# Patient Record
Sex: Female | Born: 1965 | Hispanic: No | Marital: Single | Smoking: Never smoker
Health system: Southern US, Community
[De-identification: ages and names within clinical notes are randomized; demographics above are authoritative.]

## PROBLEM LIST (undated history)

## (undated) DIAGNOSIS — C50919 Malignant neoplasm of unspecified site of unspecified female breast: Secondary | ICD-10-CM

## (undated) DIAGNOSIS — L309 Dermatitis, unspecified: Secondary | ICD-10-CM

## (undated) DIAGNOSIS — F32A Depression, unspecified: Secondary | ICD-10-CM

## (undated) DIAGNOSIS — T7840XA Allergy, unspecified, initial encounter: Secondary | ICD-10-CM

## (undated) DIAGNOSIS — F329 Major depressive disorder, single episode, unspecified: Secondary | ICD-10-CM

## (undated) DIAGNOSIS — A630 Anogenital (venereal) warts: Secondary | ICD-10-CM

## (undated) DIAGNOSIS — M1990 Unspecified osteoarthritis, unspecified site: Secondary | ICD-10-CM

## (undated) HISTORY — DX: Major depressive disorder, single episode, unspecified: F32.9

## (undated) HISTORY — DX: Dermatitis, unspecified: L30.9

## (undated) HISTORY — PX: MASTECTOMY: SHX3

## (undated) HISTORY — PX: OTHER SURGICAL HISTORY: SHX169

## (undated) HISTORY — DX: Anogenital (venereal) warts: A63.0

## (undated) HISTORY — PX: COSMETIC SURGERY: SHX468

## (undated) HISTORY — DX: Allergy, unspecified, initial encounter: T78.40XA

## (undated) HISTORY — DX: Depression, unspecified: F32.A

## (undated) HISTORY — DX: Unspecified osteoarthritis, unspecified site: M19.90

## (undated) HISTORY — DX: Malignant neoplasm of unspecified site of unspecified female breast: C50.919

---

## 2005-03-09 ENCOUNTER — Encounter: Admission: RE | Admit: 2005-03-09 | Discharge: 2005-03-09 | Payer: Self-pay | Admitting: Obstetrics and Gynecology

## 2006-05-07 ENCOUNTER — Encounter: Admission: RE | Admit: 2006-05-07 | Discharge: 2006-05-07 | Payer: Self-pay | Admitting: Obstetrics and Gynecology

## 2007-05-31 ENCOUNTER — Encounter: Admission: RE | Admit: 2007-05-31 | Discharge: 2007-05-31 | Payer: Self-pay | Admitting: Obstetrics and Gynecology

## 2007-06-04 ENCOUNTER — Encounter: Admission: RE | Admit: 2007-06-04 | Discharge: 2007-06-04 | Payer: Self-pay | Admitting: Obstetrics and Gynecology

## 2007-11-14 HISTORY — PX: BREAST BIOPSY: SHX20

## 2008-06-22 ENCOUNTER — Encounter: Admission: RE | Admit: 2008-06-22 | Discharge: 2008-06-22 | Payer: Self-pay | Admitting: Obstetrics and Gynecology

## 2008-07-06 ENCOUNTER — Encounter (INDEPENDENT_AMBULATORY_CARE_PROVIDER_SITE_OTHER): Payer: Self-pay | Admitting: Diagnostic Radiology

## 2008-07-06 ENCOUNTER — Encounter: Admission: RE | Admit: 2008-07-06 | Discharge: 2008-07-06 | Payer: Self-pay | Admitting: Obstetrics and Gynecology

## 2008-07-10 ENCOUNTER — Ambulatory Visit: Payer: Self-pay | Admitting: Oncology

## 2008-07-13 ENCOUNTER — Encounter: Admission: RE | Admit: 2008-07-13 | Discharge: 2008-07-13 | Payer: Self-pay | Admitting: Obstetrics and Gynecology

## 2008-07-16 ENCOUNTER — Encounter: Admission: RE | Admit: 2008-07-16 | Discharge: 2008-07-16 | Payer: Self-pay | Admitting: Obstetrics and Gynecology

## 2008-07-16 ENCOUNTER — Encounter (INDEPENDENT_AMBULATORY_CARE_PROVIDER_SITE_OTHER): Payer: Self-pay | Admitting: Diagnostic Radiology

## 2008-11-13 HISTORY — PX: BREAST RECONSTRUCTION: SHX9

## 2010-12-04 ENCOUNTER — Encounter: Payer: Self-pay | Admitting: Obstetrics and Gynecology

## 2010-12-05 ENCOUNTER — Encounter: Payer: Self-pay | Admitting: Obstetrics and Gynecology

## 2010-12-16 DIAGNOSIS — F4329 Adjustment disorder with other symptoms: Secondary | ICD-10-CM | POA: Insufficient documentation

## 2011-01-06 DIAGNOSIS — N83209 Unspecified ovarian cyst, unspecified side: Secondary | ICD-10-CM | POA: Insufficient documentation

## 2011-06-20 DIAGNOSIS — N912 Amenorrhea, unspecified: Secondary | ICD-10-CM | POA: Insufficient documentation

## 2011-07-06 DIAGNOSIS — F339 Major depressive disorder, recurrent, unspecified: Secondary | ICD-10-CM | POA: Insufficient documentation

## 2011-07-06 DIAGNOSIS — F0630 Mood disorder due to known physiological condition, unspecified: Secondary | ICD-10-CM | POA: Insufficient documentation

## 2012-07-26 DIAGNOSIS — Z01419 Encounter for gynecological examination (general) (routine) without abnormal findings: Secondary | ICD-10-CM | POA: Insufficient documentation

## 2013-08-12 LAB — HM MAMMOGRAPHY

## 2013-08-13 DIAGNOSIS — C50919 Malignant neoplasm of unspecified site of unspecified female breast: Secondary | ICD-10-CM | POA: Insufficient documentation

## 2013-08-13 DIAGNOSIS — Z853 Personal history of malignant neoplasm of breast: Secondary | ICD-10-CM | POA: Insufficient documentation

## 2013-10-06 LAB — HM PAP SMEAR: HM Pap smear: NEGATIVE

## 2016-01-07 DIAGNOSIS — S63642A Sprain of metacarpophalangeal joint of left thumb, initial encounter: Secondary | ICD-10-CM | POA: Insufficient documentation

## 2016-01-07 DIAGNOSIS — S5332XA Traumatic rupture of left ulnar collateral ligament, initial encounter: Secondary | ICD-10-CM | POA: Insufficient documentation

## 2016-01-11 ENCOUNTER — Other Ambulatory Visit: Payer: Self-pay | Admitting: Orthopedic Surgery

## 2016-01-11 DIAGNOSIS — S62512A Displaced fracture of proximal phalanx of left thumb, initial encounter for closed fracture: Secondary | ICD-10-CM | POA: Insufficient documentation

## 2016-01-27 ENCOUNTER — Encounter (HOSPITAL_BASED_OUTPATIENT_CLINIC_OR_DEPARTMENT_OTHER): Payer: Self-pay

## 2016-01-27 ENCOUNTER — Ambulatory Visit (HOSPITAL_BASED_OUTPATIENT_CLINIC_OR_DEPARTMENT_OTHER): Admit: 2016-01-27 | Payer: Self-pay | Admitting: Orthopedic Surgery

## 2016-01-27 SURGERY — REPAIR, LIGAMENT, ULNAR COLLATERAL
Anesthesia: Choice | Site: Thumb | Laterality: Left

## 2016-03-07 LAB — HM PAP SMEAR: HM PAP: NEGATIVE

## 2016-11-03 DIAGNOSIS — I972 Postmastectomy lymphedema syndrome: Secondary | ICD-10-CM | POA: Diagnosis not present

## 2016-11-04 DIAGNOSIS — I972 Postmastectomy lymphedema syndrome: Secondary | ICD-10-CM | POA: Diagnosis not present

## 2016-11-07 DIAGNOSIS — I972 Postmastectomy lymphedema syndrome: Secondary | ICD-10-CM | POA: Diagnosis not present

## 2016-11-08 DIAGNOSIS — I972 Postmastectomy lymphedema syndrome: Secondary | ICD-10-CM | POA: Diagnosis not present

## 2016-11-09 DIAGNOSIS — I972 Postmastectomy lymphedema syndrome: Secondary | ICD-10-CM | POA: Diagnosis not present

## 2017-01-23 DIAGNOSIS — N939 Abnormal uterine and vaginal bleeding, unspecified: Secondary | ICD-10-CM | POA: Diagnosis not present

## 2017-01-23 DIAGNOSIS — Z30432 Encounter for removal of intrauterine contraceptive device: Secondary | ICD-10-CM | POA: Diagnosis not present

## 2017-01-23 DIAGNOSIS — Z01419 Encounter for gynecological examination (general) (routine) without abnormal findings: Secondary | ICD-10-CM | POA: Diagnosis not present

## 2017-01-23 DIAGNOSIS — R5383 Other fatigue: Secondary | ICD-10-CM | POA: Diagnosis not present

## 2017-01-23 LAB — HIV ANTIBODY (ROUTINE TESTING W REFLEX): HIV: NEGATIVE

## 2017-01-25 DIAGNOSIS — Z7951 Long term (current) use of inhaled steroids: Secondary | ICD-10-CM | POA: Diagnosis not present

## 2017-01-25 DIAGNOSIS — I972 Postmastectomy lymphedema syndrome: Secondary | ICD-10-CM | POA: Diagnosis not present

## 2017-01-25 DIAGNOSIS — F329 Major depressive disorder, single episode, unspecified: Secondary | ICD-10-CM | POA: Diagnosis not present

## 2017-01-25 DIAGNOSIS — Z803 Family history of malignant neoplasm of breast: Secondary | ICD-10-CM | POA: Diagnosis not present

## 2017-01-25 DIAGNOSIS — Z9012 Acquired absence of left breast and nipple: Secondary | ICD-10-CM | POA: Diagnosis not present

## 2017-01-25 DIAGNOSIS — L905 Scar conditions and fibrosis of skin: Secondary | ICD-10-CM | POA: Diagnosis not present

## 2017-01-25 DIAGNOSIS — R079 Chest pain, unspecified: Secondary | ICD-10-CM | POA: Diagnosis not present

## 2017-01-25 DIAGNOSIS — M797 Fibromyalgia: Secondary | ICD-10-CM | POA: Diagnosis not present

## 2017-01-25 DIAGNOSIS — M419 Scoliosis, unspecified: Secondary | ICD-10-CM | POA: Diagnosis not present

## 2017-01-25 DIAGNOSIS — J302 Other seasonal allergic rhinitis: Secondary | ICD-10-CM | POA: Diagnosis not present

## 2017-01-25 DIAGNOSIS — F419 Anxiety disorder, unspecified: Secondary | ICD-10-CM | POA: Diagnosis not present

## 2017-01-25 DIAGNOSIS — Z853 Personal history of malignant neoplasm of breast: Secondary | ICD-10-CM | POA: Diagnosis not present

## 2017-01-25 DIAGNOSIS — M79602 Pain in left arm: Secondary | ICD-10-CM | POA: Diagnosis not present

## 2017-01-25 DIAGNOSIS — Z79899 Other long term (current) drug therapy: Secondary | ICD-10-CM | POA: Diagnosis not present

## 2017-01-25 DIAGNOSIS — Z9221 Personal history of antineoplastic chemotherapy: Secondary | ICD-10-CM | POA: Diagnosis not present

## 2017-01-25 DIAGNOSIS — Z923 Personal history of irradiation: Secondary | ICD-10-CM | POA: Diagnosis not present

## 2017-02-18 DIAGNOSIS — R21 Rash and other nonspecific skin eruption: Secondary | ICD-10-CM | POA: Diagnosis not present

## 2017-02-18 DIAGNOSIS — A6920 Lyme disease, unspecified: Secondary | ICD-10-CM | POA: Diagnosis not present

## 2017-03-01 DIAGNOSIS — Z9221 Personal history of antineoplastic chemotherapy: Secondary | ICD-10-CM | POA: Diagnosis not present

## 2017-03-01 DIAGNOSIS — Z923 Personal history of irradiation: Secondary | ICD-10-CM | POA: Diagnosis not present

## 2017-03-01 DIAGNOSIS — Z7951 Long term (current) use of inhaled steroids: Secondary | ICD-10-CM | POA: Diagnosis not present

## 2017-03-01 DIAGNOSIS — Z79899 Other long term (current) drug therapy: Secondary | ICD-10-CM | POA: Diagnosis not present

## 2017-03-01 DIAGNOSIS — F419 Anxiety disorder, unspecified: Secondary | ICD-10-CM | POA: Diagnosis not present

## 2017-03-01 DIAGNOSIS — R079 Chest pain, unspecified: Secondary | ICD-10-CM | POA: Diagnosis not present

## 2017-03-01 DIAGNOSIS — I972 Postmastectomy lymphedema syndrome: Secondary | ICD-10-CM | POA: Diagnosis not present

## 2017-03-01 DIAGNOSIS — M79602 Pain in left arm: Secondary | ICD-10-CM | POA: Diagnosis not present

## 2017-03-01 DIAGNOSIS — F329 Major depressive disorder, single episode, unspecified: Secondary | ICD-10-CM | POA: Diagnosis not present

## 2017-03-01 DIAGNOSIS — Z853 Personal history of malignant neoplasm of breast: Secondary | ICD-10-CM | POA: Diagnosis not present

## 2017-03-01 DIAGNOSIS — M419 Scoliosis, unspecified: Secondary | ICD-10-CM | POA: Diagnosis not present

## 2017-03-01 DIAGNOSIS — M797 Fibromyalgia: Secondary | ICD-10-CM | POA: Diagnosis not present

## 2017-03-01 DIAGNOSIS — L905 Scar conditions and fibrosis of skin: Secondary | ICD-10-CM | POA: Diagnosis not present

## 2017-03-01 DIAGNOSIS — Z9012 Acquired absence of left breast and nipple: Secondary | ICD-10-CM | POA: Diagnosis not present

## 2017-03-01 DIAGNOSIS — Z803 Family history of malignant neoplasm of breast: Secondary | ICD-10-CM | POA: Diagnosis not present

## 2017-03-01 DIAGNOSIS — J302 Other seasonal allergic rhinitis: Secondary | ICD-10-CM | POA: Diagnosis not present

## 2017-03-06 DIAGNOSIS — R922 Inconclusive mammogram: Secondary | ICD-10-CM | POA: Diagnosis not present

## 2017-03-06 DIAGNOSIS — Z803 Family history of malignant neoplasm of breast: Secondary | ICD-10-CM | POA: Diagnosis not present

## 2017-03-06 DIAGNOSIS — I890 Lymphedema, not elsewhere classified: Secondary | ICD-10-CM | POA: Diagnosis not present

## 2017-03-06 DIAGNOSIS — Z170 Estrogen receptor positive status [ER+]: Secondary | ICD-10-CM | POA: Diagnosis not present

## 2017-03-06 DIAGNOSIS — C50912 Malignant neoplasm of unspecified site of left female breast: Secondary | ICD-10-CM | POA: Diagnosis not present

## 2017-03-06 DIAGNOSIS — Z6826 Body mass index (BMI) 26.0-26.9, adult: Secondary | ICD-10-CM | POA: Diagnosis not present

## 2017-03-08 DIAGNOSIS — M79602 Pain in left arm: Secondary | ICD-10-CM | POA: Diagnosis not present

## 2017-03-08 DIAGNOSIS — F419 Anxiety disorder, unspecified: Secondary | ICD-10-CM | POA: Diagnosis not present

## 2017-03-08 DIAGNOSIS — Z9221 Personal history of antineoplastic chemotherapy: Secondary | ICD-10-CM | POA: Diagnosis not present

## 2017-03-08 DIAGNOSIS — Z923 Personal history of irradiation: Secondary | ICD-10-CM | POA: Diagnosis not present

## 2017-03-08 DIAGNOSIS — M797 Fibromyalgia: Secondary | ICD-10-CM | POA: Diagnosis not present

## 2017-03-08 DIAGNOSIS — I972 Postmastectomy lymphedema syndrome: Secondary | ICD-10-CM | POA: Diagnosis not present

## 2017-03-08 DIAGNOSIS — Z853 Personal history of malignant neoplasm of breast: Secondary | ICD-10-CM | POA: Diagnosis not present

## 2017-03-08 DIAGNOSIS — R079 Chest pain, unspecified: Secondary | ICD-10-CM | POA: Diagnosis not present

## 2017-03-08 DIAGNOSIS — F329 Major depressive disorder, single episode, unspecified: Secondary | ICD-10-CM | POA: Diagnosis not present

## 2017-03-08 DIAGNOSIS — J302 Other seasonal allergic rhinitis: Secondary | ICD-10-CM | POA: Diagnosis not present

## 2017-03-08 DIAGNOSIS — Z7951 Long term (current) use of inhaled steroids: Secondary | ICD-10-CM | POA: Diagnosis not present

## 2017-03-08 DIAGNOSIS — L905 Scar conditions and fibrosis of skin: Secondary | ICD-10-CM | POA: Diagnosis not present

## 2017-03-08 DIAGNOSIS — M419 Scoliosis, unspecified: Secondary | ICD-10-CM | POA: Diagnosis not present

## 2017-03-08 DIAGNOSIS — Z9012 Acquired absence of left breast and nipple: Secondary | ICD-10-CM | POA: Diagnosis not present

## 2017-03-08 DIAGNOSIS — Z79899 Other long term (current) drug therapy: Secondary | ICD-10-CM | POA: Diagnosis not present

## 2017-03-08 DIAGNOSIS — Z803 Family history of malignant neoplasm of breast: Secondary | ICD-10-CM | POA: Diagnosis not present

## 2017-03-15 DIAGNOSIS — Z853 Personal history of malignant neoplasm of breast: Secondary | ICD-10-CM | POA: Diagnosis not present

## 2017-03-15 DIAGNOSIS — Z79899 Other long term (current) drug therapy: Secondary | ICD-10-CM | POA: Diagnosis not present

## 2017-03-15 DIAGNOSIS — Z9221 Personal history of antineoplastic chemotherapy: Secondary | ICD-10-CM | POA: Diagnosis not present

## 2017-03-15 DIAGNOSIS — J302 Other seasonal allergic rhinitis: Secondary | ICD-10-CM | POA: Diagnosis not present

## 2017-03-15 DIAGNOSIS — M797 Fibromyalgia: Secondary | ICD-10-CM | POA: Diagnosis not present

## 2017-03-15 DIAGNOSIS — Z923 Personal history of irradiation: Secondary | ICD-10-CM | POA: Diagnosis not present

## 2017-03-15 DIAGNOSIS — F329 Major depressive disorder, single episode, unspecified: Secondary | ICD-10-CM | POA: Diagnosis not present

## 2017-03-15 DIAGNOSIS — M79602 Pain in left arm: Secondary | ICD-10-CM | POA: Diagnosis not present

## 2017-03-15 DIAGNOSIS — M419 Scoliosis, unspecified: Secondary | ICD-10-CM | POA: Diagnosis not present

## 2017-03-15 DIAGNOSIS — I972 Postmastectomy lymphedema syndrome: Secondary | ICD-10-CM | POA: Diagnosis not present

## 2017-03-15 DIAGNOSIS — R079 Chest pain, unspecified: Secondary | ICD-10-CM | POA: Diagnosis not present

## 2017-03-15 DIAGNOSIS — F419 Anxiety disorder, unspecified: Secondary | ICD-10-CM | POA: Diagnosis not present

## 2017-03-15 DIAGNOSIS — Z9012 Acquired absence of left breast and nipple: Secondary | ICD-10-CM | POA: Diagnosis not present

## 2017-03-15 DIAGNOSIS — L905 Scar conditions and fibrosis of skin: Secondary | ICD-10-CM | POA: Diagnosis not present

## 2017-03-15 DIAGNOSIS — Z803 Family history of malignant neoplasm of breast: Secondary | ICD-10-CM | POA: Diagnosis not present

## 2017-03-15 DIAGNOSIS — Z7951 Long term (current) use of inhaled steroids: Secondary | ICD-10-CM | POA: Diagnosis not present

## 2017-04-05 DIAGNOSIS — L905 Scar conditions and fibrosis of skin: Secondary | ICD-10-CM | POA: Diagnosis not present

## 2017-04-05 DIAGNOSIS — M797 Fibromyalgia: Secondary | ICD-10-CM | POA: Diagnosis not present

## 2017-04-05 DIAGNOSIS — I972 Postmastectomy lymphedema syndrome: Secondary | ICD-10-CM | POA: Diagnosis not present

## 2017-04-05 DIAGNOSIS — M419 Scoliosis, unspecified: Secondary | ICD-10-CM | POA: Diagnosis not present

## 2017-04-05 DIAGNOSIS — M79602 Pain in left arm: Secondary | ICD-10-CM | POA: Diagnosis not present

## 2017-04-05 DIAGNOSIS — J302 Other seasonal allergic rhinitis: Secondary | ICD-10-CM | POA: Diagnosis not present

## 2017-04-05 DIAGNOSIS — Z7951 Long term (current) use of inhaled steroids: Secondary | ICD-10-CM | POA: Diagnosis not present

## 2017-04-05 DIAGNOSIS — Z9221 Personal history of antineoplastic chemotherapy: Secondary | ICD-10-CM | POA: Diagnosis not present

## 2017-04-05 DIAGNOSIS — F329 Major depressive disorder, single episode, unspecified: Secondary | ICD-10-CM | POA: Diagnosis not present

## 2017-04-05 DIAGNOSIS — R079 Chest pain, unspecified: Secondary | ICD-10-CM | POA: Diagnosis not present

## 2017-04-05 DIAGNOSIS — Z79899 Other long term (current) drug therapy: Secondary | ICD-10-CM | POA: Diagnosis not present

## 2017-04-05 DIAGNOSIS — Z803 Family history of malignant neoplasm of breast: Secondary | ICD-10-CM | POA: Diagnosis not present

## 2017-04-05 DIAGNOSIS — Z853 Personal history of malignant neoplasm of breast: Secondary | ICD-10-CM | POA: Diagnosis not present

## 2017-04-05 DIAGNOSIS — Z923 Personal history of irradiation: Secondary | ICD-10-CM | POA: Diagnosis not present

## 2017-04-05 DIAGNOSIS — F419 Anxiety disorder, unspecified: Secondary | ICD-10-CM | POA: Diagnosis not present

## 2017-04-05 DIAGNOSIS — Z9012 Acquired absence of left breast and nipple: Secondary | ICD-10-CM | POA: Diagnosis not present

## 2017-04-25 DIAGNOSIS — Z79899 Other long term (current) drug therapy: Secondary | ICD-10-CM | POA: Diagnosis not present

## 2017-04-25 DIAGNOSIS — Z7951 Long term (current) use of inhaled steroids: Secondary | ICD-10-CM | POA: Diagnosis not present

## 2017-04-25 DIAGNOSIS — Z853 Personal history of malignant neoplasm of breast: Secondary | ICD-10-CM | POA: Diagnosis not present

## 2017-04-25 DIAGNOSIS — F419 Anxiety disorder, unspecified: Secondary | ICD-10-CM | POA: Diagnosis not present

## 2017-04-25 DIAGNOSIS — R079 Chest pain, unspecified: Secondary | ICD-10-CM | POA: Diagnosis not present

## 2017-04-25 DIAGNOSIS — M797 Fibromyalgia: Secondary | ICD-10-CM | POA: Diagnosis not present

## 2017-04-25 DIAGNOSIS — M419 Scoliosis, unspecified: Secondary | ICD-10-CM | POA: Diagnosis not present

## 2017-04-25 DIAGNOSIS — F329 Major depressive disorder, single episode, unspecified: Secondary | ICD-10-CM | POA: Diagnosis not present

## 2017-04-25 DIAGNOSIS — Z803 Family history of malignant neoplasm of breast: Secondary | ICD-10-CM | POA: Diagnosis not present

## 2017-04-25 DIAGNOSIS — J302 Other seasonal allergic rhinitis: Secondary | ICD-10-CM | POA: Diagnosis not present

## 2017-04-25 DIAGNOSIS — L905 Scar conditions and fibrosis of skin: Secondary | ICD-10-CM | POA: Diagnosis not present

## 2017-04-25 DIAGNOSIS — Z9012 Acquired absence of left breast and nipple: Secondary | ICD-10-CM | POA: Diagnosis not present

## 2017-04-25 DIAGNOSIS — I972 Postmastectomy lymphedema syndrome: Secondary | ICD-10-CM | POA: Diagnosis not present

## 2017-04-25 DIAGNOSIS — M79602 Pain in left arm: Secondary | ICD-10-CM | POA: Diagnosis not present

## 2017-04-25 DIAGNOSIS — Z923 Personal history of irradiation: Secondary | ICD-10-CM | POA: Diagnosis not present

## 2017-04-25 DIAGNOSIS — Z9221 Personal history of antineoplastic chemotherapy: Secondary | ICD-10-CM | POA: Diagnosis not present

## 2017-05-03 DIAGNOSIS — L905 Scar conditions and fibrosis of skin: Secondary | ICD-10-CM | POA: Diagnosis not present

## 2017-05-03 DIAGNOSIS — J302 Other seasonal allergic rhinitis: Secondary | ICD-10-CM | POA: Diagnosis not present

## 2017-05-03 DIAGNOSIS — M419 Scoliosis, unspecified: Secondary | ICD-10-CM | POA: Diagnosis not present

## 2017-05-03 DIAGNOSIS — Z7951 Long term (current) use of inhaled steroids: Secondary | ICD-10-CM | POA: Diagnosis not present

## 2017-05-03 DIAGNOSIS — R079 Chest pain, unspecified: Secondary | ICD-10-CM | POA: Diagnosis not present

## 2017-05-03 DIAGNOSIS — I972 Postmastectomy lymphedema syndrome: Secondary | ICD-10-CM | POA: Diagnosis not present

## 2017-05-03 DIAGNOSIS — Z9012 Acquired absence of left breast and nipple: Secondary | ICD-10-CM | POA: Diagnosis not present

## 2017-05-03 DIAGNOSIS — Z803 Family history of malignant neoplasm of breast: Secondary | ICD-10-CM | POA: Diagnosis not present

## 2017-05-03 DIAGNOSIS — Z853 Personal history of malignant neoplasm of breast: Secondary | ICD-10-CM | POA: Diagnosis not present

## 2017-05-03 DIAGNOSIS — Z923 Personal history of irradiation: Secondary | ICD-10-CM | POA: Diagnosis not present

## 2017-05-03 DIAGNOSIS — Z79899 Other long term (current) drug therapy: Secondary | ICD-10-CM | POA: Diagnosis not present

## 2017-05-03 DIAGNOSIS — F419 Anxiety disorder, unspecified: Secondary | ICD-10-CM | POA: Diagnosis not present

## 2017-05-03 DIAGNOSIS — M797 Fibromyalgia: Secondary | ICD-10-CM | POA: Diagnosis not present

## 2017-05-03 DIAGNOSIS — M79602 Pain in left arm: Secondary | ICD-10-CM | POA: Diagnosis not present

## 2017-05-03 DIAGNOSIS — F329 Major depressive disorder, single episode, unspecified: Secondary | ICD-10-CM | POA: Diagnosis not present

## 2017-05-03 DIAGNOSIS — Z9221 Personal history of antineoplastic chemotherapy: Secondary | ICD-10-CM | POA: Diagnosis not present

## 2017-05-10 DIAGNOSIS — I972 Postmastectomy lymphedema syndrome: Secondary | ICD-10-CM | POA: Diagnosis not present

## 2017-05-10 DIAGNOSIS — Z9012 Acquired absence of left breast and nipple: Secondary | ICD-10-CM | POA: Diagnosis not present

## 2017-05-10 DIAGNOSIS — Z7951 Long term (current) use of inhaled steroids: Secondary | ICD-10-CM | POA: Diagnosis not present

## 2017-05-10 DIAGNOSIS — L905 Scar conditions and fibrosis of skin: Secondary | ICD-10-CM | POA: Diagnosis not present

## 2017-05-10 DIAGNOSIS — Z803 Family history of malignant neoplasm of breast: Secondary | ICD-10-CM | POA: Diagnosis not present

## 2017-05-10 DIAGNOSIS — Z853 Personal history of malignant neoplasm of breast: Secondary | ICD-10-CM | POA: Diagnosis not present

## 2017-05-10 DIAGNOSIS — R079 Chest pain, unspecified: Secondary | ICD-10-CM | POA: Diagnosis not present

## 2017-05-10 DIAGNOSIS — Z9221 Personal history of antineoplastic chemotherapy: Secondary | ICD-10-CM | POA: Diagnosis not present

## 2017-05-10 DIAGNOSIS — Z923 Personal history of irradiation: Secondary | ICD-10-CM | POA: Diagnosis not present

## 2017-05-10 DIAGNOSIS — M79602 Pain in left arm: Secondary | ICD-10-CM | POA: Diagnosis not present

## 2017-05-10 DIAGNOSIS — M419 Scoliosis, unspecified: Secondary | ICD-10-CM | POA: Diagnosis not present

## 2017-05-10 DIAGNOSIS — F329 Major depressive disorder, single episode, unspecified: Secondary | ICD-10-CM | POA: Diagnosis not present

## 2017-05-10 DIAGNOSIS — F419 Anxiety disorder, unspecified: Secondary | ICD-10-CM | POA: Diagnosis not present

## 2017-05-10 DIAGNOSIS — J302 Other seasonal allergic rhinitis: Secondary | ICD-10-CM | POA: Diagnosis not present

## 2017-05-10 DIAGNOSIS — Z79899 Other long term (current) drug therapy: Secondary | ICD-10-CM | POA: Diagnosis not present

## 2017-05-10 DIAGNOSIS — M797 Fibromyalgia: Secondary | ICD-10-CM | POA: Diagnosis not present

## 2017-09-21 DIAGNOSIS — Z23 Encounter for immunization: Secondary | ICD-10-CM | POA: Diagnosis not present

## 2017-10-31 DIAGNOSIS — I972 Postmastectomy lymphedema syndrome: Secondary | ICD-10-CM | POA: Diagnosis not present

## 2017-11-01 DIAGNOSIS — I972 Postmastectomy lymphedema syndrome: Secondary | ICD-10-CM | POA: Diagnosis not present

## 2017-11-02 DIAGNOSIS — I972 Postmastectomy lymphedema syndrome: Secondary | ICD-10-CM | POA: Diagnosis not present

## 2017-11-05 DIAGNOSIS — I972 Postmastectomy lymphedema syndrome: Secondary | ICD-10-CM | POA: Diagnosis not present

## 2017-11-07 DIAGNOSIS — I972 Postmastectomy lymphedema syndrome: Secondary | ICD-10-CM | POA: Diagnosis not present

## 2017-11-08 DIAGNOSIS — I972 Postmastectomy lymphedema syndrome: Secondary | ICD-10-CM | POA: Diagnosis not present

## 2017-11-26 DIAGNOSIS — B373 Candidiasis of vulva and vagina: Secondary | ICD-10-CM | POA: Diagnosis not present

## 2017-11-26 DIAGNOSIS — J0140 Acute pansinusitis, unspecified: Secondary | ICD-10-CM | POA: Diagnosis not present

## 2017-11-26 DIAGNOSIS — R05 Cough: Secondary | ICD-10-CM | POA: Diagnosis not present

## 2018-02-05 DIAGNOSIS — H109 Unspecified conjunctivitis: Secondary | ICD-10-CM | POA: Diagnosis not present

## 2018-02-05 DIAGNOSIS — H1131 Conjunctival hemorrhage, right eye: Secondary | ICD-10-CM | POA: Diagnosis not present

## 2018-02-06 ENCOUNTER — Ambulatory Visit: Payer: Self-pay | Admitting: Physician Assistant

## 2018-02-07 DIAGNOSIS — H1131 Conjunctival hemorrhage, right eye: Secondary | ICD-10-CM | POA: Diagnosis not present

## 2018-02-07 DIAGNOSIS — H11823 Conjunctivochalasis, bilateral: Secondary | ICD-10-CM | POA: Diagnosis not present

## 2018-02-18 DIAGNOSIS — H40003 Preglaucoma, unspecified, bilateral: Secondary | ICD-10-CM | POA: Diagnosis not present

## 2018-02-25 ENCOUNTER — Ambulatory Visit: Payer: Self-pay | Admitting: Family Medicine

## 2018-03-12 DIAGNOSIS — Z853 Personal history of malignant neoplasm of breast: Secondary | ICD-10-CM | POA: Diagnosis not present

## 2018-03-12 DIAGNOSIS — F329 Major depressive disorder, single episode, unspecified: Secondary | ICD-10-CM | POA: Diagnosis not present

## 2018-03-12 DIAGNOSIS — Z79899 Other long term (current) drug therapy: Secondary | ICD-10-CM | POA: Diagnosis not present

## 2018-03-12 DIAGNOSIS — Z170 Estrogen receptor positive status [ER+]: Secondary | ICD-10-CM | POA: Diagnosis not present

## 2018-03-12 DIAGNOSIS — Z7952 Long term (current) use of systemic steroids: Secondary | ICD-10-CM | POA: Diagnosis not present

## 2018-03-12 DIAGNOSIS — J302 Other seasonal allergic rhinitis: Secondary | ICD-10-CM | POA: Diagnosis not present

## 2018-03-12 DIAGNOSIS — Z9012 Acquired absence of left breast and nipple: Secondary | ICD-10-CM | POA: Diagnosis not present

## 2018-03-12 DIAGNOSIS — Z79891 Long term (current) use of opiate analgesic: Secondary | ICD-10-CM | POA: Diagnosis not present

## 2018-03-12 DIAGNOSIS — Z803 Family history of malignant neoplasm of breast: Secondary | ICD-10-CM | POA: Diagnosis not present

## 2018-03-12 DIAGNOSIS — M797 Fibromyalgia: Secondary | ICD-10-CM | POA: Diagnosis not present

## 2018-03-12 DIAGNOSIS — Z923 Personal history of irradiation: Secondary | ICD-10-CM | POA: Diagnosis not present

## 2018-03-12 DIAGNOSIS — C50912 Malignant neoplasm of unspecified site of left female breast: Secondary | ICD-10-CM | POA: Diagnosis not present

## 2018-03-12 DIAGNOSIS — F419 Anxiety disorder, unspecified: Secondary | ICD-10-CM | POA: Diagnosis not present

## 2018-03-12 DIAGNOSIS — Z08 Encounter for follow-up examination after completed treatment for malignant neoplasm: Secondary | ICD-10-CM | POA: Diagnosis not present

## 2018-03-12 DIAGNOSIS — Z7951 Long term (current) use of inhaled steroids: Secondary | ICD-10-CM | POA: Diagnosis not present

## 2018-03-12 DIAGNOSIS — Z6825 Body mass index (BMI) 25.0-25.9, adult: Secondary | ICD-10-CM | POA: Diagnosis not present

## 2018-03-12 LAB — HM MAMMOGRAPHY

## 2018-05-10 ENCOUNTER — Ambulatory Visit: Payer: Self-pay | Admitting: Family Medicine

## 2018-05-30 DIAGNOSIS — J018 Other acute sinusitis: Secondary | ICD-10-CM | POA: Diagnosis not present

## 2018-05-30 DIAGNOSIS — R05 Cough: Secondary | ICD-10-CM | POA: Diagnosis not present

## 2018-05-30 DIAGNOSIS — B373 Candidiasis of vulva and vagina: Secondary | ICD-10-CM | POA: Diagnosis not present

## 2018-07-19 ENCOUNTER — Ambulatory Visit: Payer: Self-pay | Admitting: Family Medicine

## 2018-07-21 NOTE — Progress Notes (Deleted)
   Natasha Mcclain is a 52 y.o. female is here to [facility].   No care team member to display   History of Present Illness:   {CMA SCRIBE ATTESTATION}  HPI:  Health Maintenance Due  Topic Date Due  . HIV Screening  12/13/1982  . TETANUS/TDAP  12/13/1986  . PAP SMEAR  12/13/1988  . MAMMOGRAM  12/13/2017  . COLONOSCOPY  12/13/2017  . INFLUENZA VACCINE  06/13/2018   No flowsheet data found.  PMHx, SurgHx, SocialHx, Medications, and Allergies were reviewed in the Visit Navigator and updated as appropriate.   No past medical history on file.  *** The histories are not reviewed yet. Please review them in the "History" navigator section and refresh this [place].  No family history on file.  Social History   Tobacco Use  . Smoking status: Not on file  Substance Use Topics  . Alcohol use: Not on file  . Drug use: Not on file    Current Medications and Allergies:  No current outpatient medications on file.  Allergies not on file Review of Systems:   Pertinent items are noted in the HPI. Otherwise, ROS is negative.  Vitals:  There were no vitals filed for this visit.   There is no height or weight on file to calculate BMI.  Physical Exam:   Physical Exam  No results found for this or any previous visit.  Assessment and Plan:   There are no diagnoses linked to this encounter.  . Reviewed expectations re: course of current medical issues. . Discussed self-management of symptoms. . Outlined signs and symptoms indicating need for more acute intervention. . Patient verbalized understanding and all questions were answered. Marland Kitchen Health Maintenance issues including appropriate healthy diet, exercise, and smoking avoidance were discussed with patient. . See orders for this visit as documented in the electronic medical record. . Patient received an After Visit Summary.  *** CMA served as Education administrator during this visit. History, Physical, and Plan performed by medical  provider. The above documentation has been reviewed and is accurate and complete. Briscoe Deutscher, D.O.   Briscoe Deutscher, DO [place], [facility] 07/21/2018

## 2018-07-22 ENCOUNTER — Telehealth: Payer: Self-pay | Admitting: Family Medicine

## 2018-07-22 ENCOUNTER — Ambulatory Visit: Payer: Self-pay | Admitting: Family Medicine

## 2018-07-22 NOTE — Telephone Encounter (Signed)
Okay to reschedule.

## 2018-07-22 NOTE — Telephone Encounter (Signed)
Patient is reschedule for 08/12/18.

## 2018-07-22 NOTE — Telephone Encounter (Signed)
Dr. Juleen China, are you willing to reschedule the patient?

## 2018-07-22 NOTE — Telephone Encounter (Signed)
Copied from [place] [id number]. Topic: Quick Communication - See Telephone Encounter >> Jul 22, 2018  1:29 PM Blase Mess A wrote: CRM for notification. See Telephone encounter for: 07/22/18.  Patient is running 10 minutes Per Cara Patient needed to reschedule.  Patient said she wanted Dr. Juleen China to know that she is breast cancer survivor and she is seeking the best medical care from her.
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
## 2018-08-12 ENCOUNTER — Encounter: Payer: Self-pay | Admitting: Family Medicine

## 2018-08-12 ENCOUNTER — Ambulatory Visit: Payer: BLUE CROSS/BLUE SHIELD | Admitting: Family Medicine

## 2018-08-12 VITALS — BP 114/74 | HR 69 | Temp 98.2°F | Ht 69.0 in | Wt 178.0 lb

## 2018-08-12 DIAGNOSIS — Z1211 Encounter for screening for malignant neoplasm of colon: Secondary | ICD-10-CM | POA: Diagnosis not present

## 2018-08-12 DIAGNOSIS — G4710 Hypersomnia, unspecified: Secondary | ICD-10-CM | POA: Diagnosis not present

## 2018-08-12 DIAGNOSIS — N951 Menopausal and female climacteric states: Secondary | ICD-10-CM

## 2018-08-12 DIAGNOSIS — R5383 Other fatigue: Secondary | ICD-10-CM

## 2018-08-12 DIAGNOSIS — Z853 Personal history of malignant neoplasm of breast: Secondary | ICD-10-CM

## 2018-08-12 DIAGNOSIS — M62838 Other muscle spasm: Secondary | ICD-10-CM

## 2018-08-12 DIAGNOSIS — M489 Spondylopathy, unspecified: Secondary | ICD-10-CM

## 2018-08-12 DIAGNOSIS — J302 Other seasonal allergic rhinitis: Secondary | ICD-10-CM

## 2018-08-12 DIAGNOSIS — M4124 Other idiopathic scoliosis, thoracic region: Secondary | ICD-10-CM

## 2018-08-12 DIAGNOSIS — I890 Lymphedema, not elsewhere classified: Secondary | ICD-10-CM

## 2018-08-12 DIAGNOSIS — E559 Vitamin D deficiency, unspecified: Secondary | ICD-10-CM | POA: Diagnosis not present

## 2018-08-12 DIAGNOSIS — N6019 Diffuse cystic mastopathy of unspecified breast: Secondary | ICD-10-CM

## 2018-08-12 LAB — TSH: TSH: 1.54 u[IU]/mL (ref 0.35–4.50)

## 2018-08-12 LAB — COMPREHENSIVE METABOLIC PANEL
ALT: 28 U/L (ref 0–35)
AST: 23 U/L (ref 0–37)
Albumin: 4.5 g/dL (ref 3.5–5.2)
Alkaline Phosphatase: 94 U/L (ref 39–117)
BUN: 7 mg/dL (ref 6–23)
CO2: 28 mEq/L (ref 19–32)
Calcium: 9.9 mg/dL (ref 8.4–10.5)
Chloride: 101 mEq/L (ref 96–112)
Creatinine, Ser: 0.47 mg/dL (ref 0.40–1.20)
GFR: 148.68 mL/min (ref >60.00)
Glucose, Bld: 116 mg/dL — ABNORMAL HIGH (ref 70–99)
Potassium: 4.4 mEq/L (ref 3.5–5.1)
Sodium: 136 mEq/L (ref 135–145)
Total Bilirubin: 0.5 mg/dL (ref 0.2–1.2)
Total Protein: 7.2 g/dL (ref 6.0–8.3)

## 2018-08-12 LAB — LIPID PANEL
Cholesterol: 219 mg/dL — ABNORMAL HIGH (ref 0–200)
HDL: 70.6 mg/dL (ref >39.00)
LDL Cholesterol: 127 mg/dL — ABNORMAL HIGH (ref 0–99)
NonHDL: 148.28
Total CHOL/HDL Ratio: 3
Triglycerides: 105 mg/dL (ref 0.0–149.0)
VLDL: 21 mg/dL (ref 0.0–40.0)

## 2018-08-12 LAB — VITAMIN B12: Vitamin B-12: 278 pg/mL (ref 211–911)

## 2018-08-12 LAB — FOLLICLE STIMULATING HORMONE: FSH: 14.9 m[IU]/mL

## 2018-08-12 LAB — LUTEINIZING HORMONE: LH: 10.03 m[IU]/mL

## 2018-08-12 LAB — VITAMIN D 25 HYDROXY (VIT D DEFICIENCY, FRACTURES): VITD: 25.05 ng/mL — ABNORMAL LOW (ref 30.00–100.00)

## 2018-08-12 MED ORDER — DIAZEPAM 10 MG PO TABS: 10.0000 mg | ORAL_TABLET | Freq: Every day | ORAL | 0 refills | Status: DC

## 2018-08-12 MED ORDER — VENLAFAXINE HCL 37.5 MG PO TABS: 37.5000 mg | ORAL_TABLET | Freq: Two times a day (BID) | ORAL | 1 refills | Status: DC

## 2018-08-12 MED ORDER — FLUTICASONE PROPIONATE 50 MCG/ACT NA SUSP: NASAL | 3 refills | Status: DC

## 2018-08-12 MED ORDER — CETIRIZINE HCL 10 MG PO CAPS: 10.0000 mg | ORAL_CAPSULE | Freq: Every day | ORAL | 6 refills | Status: AC

## 2018-08-12 MED ORDER — AMPHETAMINE-DEXTROAMPHETAMINE 5 MG PO TABS: 5.0000 mg | ORAL_TABLET | Freq: Every day | ORAL | 0 refills | Status: DC

## 2018-08-12 NOTE — Patient Instructions (Signed)
I have made your CT appointment for 08/22/18 arrive at 12:45.

## 2018-08-20 ENCOUNTER — Encounter: Payer: Self-pay | Admitting: Family Medicine

## 2018-08-22 ENCOUNTER — Inpatient Hospital Stay: Admission: RE | Admit: 2018-08-22 | Payer: BLUE CROSS/BLUE SHIELD | Source: Ambulatory Visit

## 2018-08-26 ENCOUNTER — Other Ambulatory Visit: Payer: Self-pay | Admitting: *Deleted

## 2018-08-26 DIAGNOSIS — N951 Menopausal and female climacteric states: Secondary | ICD-10-CM

## 2018-08-26 MED ORDER — VENLAFAXINE HCL 37.5 MG PO TABS: 37.5000 mg | ORAL_TABLET | Freq: Every day | ORAL | 0 refills | Status: DC

## 2018-09-11 ENCOUNTER — Encounter: Payer: Self-pay | Admitting: Family Medicine

## 2018-09-16 DIAGNOSIS — C50912 Malignant neoplasm of unspecified site of left female breast: Secondary | ICD-10-CM

## 2018-09-17 ENCOUNTER — Ambulatory Visit: Payer: BLUE CROSS/BLUE SHIELD | Admitting: Family Medicine

## 2018-09-27 ENCOUNTER — Other Ambulatory Visit: Payer: Self-pay | Admitting: Family Medicine

## 2018-09-27 DIAGNOSIS — M62838 Other muscle spasm: Secondary | ICD-10-CM

## 2018-10-01 ENCOUNTER — Ambulatory Visit: Payer: BLUE CROSS/BLUE SHIELD | Admitting: Family Medicine

## 2018-10-01 ENCOUNTER — Encounter: Payer: Self-pay | Admitting: Family Medicine

## 2018-10-01 VITALS — BP 124/60 | HR 94 | Temp 98.4°F | Ht 69.0 in | Wt 174.2 lb

## 2018-10-01 DIAGNOSIS — F902 Attention-deficit hyperactivity disorder, combined type: Secondary | ICD-10-CM | POA: Diagnosis not present

## 2018-10-01 DIAGNOSIS — Z114 Encounter for screening for human immunodeficiency virus [HIV]: Secondary | ICD-10-CM

## 2018-10-01 DIAGNOSIS — B070 Plantar wart: Secondary | ICD-10-CM

## 2018-10-01 DIAGNOSIS — Z23 Encounter for immunization: Secondary | ICD-10-CM | POA: Diagnosis not present

## 2018-10-01 DIAGNOSIS — I890 Lymphedema, not elsewhere classified: Secondary | ICD-10-CM | POA: Diagnosis not present

## 2018-10-01 DIAGNOSIS — L853 Xerosis cutis: Secondary | ICD-10-CM

## 2018-10-01 DIAGNOSIS — M62838 Other muscle spasm: Secondary | ICD-10-CM | POA: Diagnosis not present

## 2018-10-01 DIAGNOSIS — N951 Menopausal and female climacteric states: Secondary | ICD-10-CM

## 2018-10-01 MED ORDER — DIAZEPAM 10 MG PO TABS: 10.0000 mg | ORAL_TABLET | Freq: Every day | ORAL | 0 refills | Status: DC

## 2018-10-01 MED ORDER — AMPHETAMINE-DEXTROAMPHETAMINE 20 MG PO TABS: 20.0000 mg | ORAL_TABLET | Freq: Two times a day (BID) | ORAL | 0 refills | Status: DC

## 2018-10-01 MED ORDER — AMMONIUM LACTATE 12 % EX CREA: TOPICAL_CREAM | CUTANEOUS | 0 refills | Status: DC | PRN

## 2018-10-01 MED ORDER — VENLAFAXINE HCL ER 37.5 MG PO CP24: 37.5000 mg | ORAL_CAPSULE | Freq: Every day | ORAL | 6 refills | Status: DC
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
## 2018-10-02 ENCOUNTER — Ambulatory Visit: Payer: Self-pay

## 2018-10-02 ENCOUNTER — Encounter: Payer: Self-pay | Admitting: Family Medicine

## 2018-10-02 NOTE — Telephone Encounter (Signed)
Patient called in and says "I had a dental procedure on Monday, that they said I didn't have an abscessed tooth or anything. Yesterday, I was in the office and received both the flu and tetanus shots. Last night, I developed chills, body aches, and I knew I had a fever, but no thermometer to check. I felt so bad I almost went to the emergency room. Finally it passed and I was real hot. Today I went and bought advil and a thermometer. I took 2 advil around 1420 and checked my temperature, which it was 99.4. I didn't know if I need to be proactive and ask for an antibiotic or what I need to do. I called my dentist and they will see me tomorrow at 1400 just to look in my mouth and make sure there is no infection brewing." I advised that with the flu vaccine, the side effect is symptoms of the flu with a low grade temperature, body aches, chills, feeling tired. I advised to take the advil 2-3 tabs every 6 hours for the next 24 to 48 hours around the clock. Advised if the temperature increases above 100.9, then to call us back for an appointment. I also advised that if there is dental infection, the dentist will be able to prescribe an antibiotic. I advised Dr. Juleen China would have to evaluate her in the office before prescribing antibiotics. Patient verbalized understanding of the above advice and will call back or go to UC/ED is symptoms are worsened.   Reason for Disposition . Health Information question, no triage required and triager able to answer question  Protocols used: INFORMATION ONLY CALL-A-AH

## 2018-10-02 NOTE — Telephone Encounter (Signed)
Please advise 

## 2018-10-02 NOTE — Telephone Encounter (Signed)
See note

## 2018-10-03 NOTE — Telephone Encounter (Signed)
Agree with RN triage note.

## 2018-10-14 ENCOUNTER — Other Ambulatory Visit: Payer: Self-pay | Admitting: Family Medicine

## 2018-10-14 DIAGNOSIS — N951 Menopausal and female climacteric states: Secondary | ICD-10-CM

## 2018-10-28 DIAGNOSIS — C50919 Malignant neoplasm of unspecified site of unspecified female breast: Secondary | ICD-10-CM | POA: Diagnosis not present

## 2018-10-29 DIAGNOSIS — C50919 Malignant neoplasm of unspecified site of unspecified female breast: Secondary | ICD-10-CM | POA: Diagnosis not present

## 2018-10-30 DIAGNOSIS — C50919 Malignant neoplasm of unspecified site of unspecified female breast: Secondary | ICD-10-CM | POA: Diagnosis not present

## 2018-10-31 DIAGNOSIS — C50919 Malignant neoplasm of unspecified site of unspecified female breast: Secondary | ICD-10-CM | POA: Diagnosis not present

## 2018-11-01 DIAGNOSIS — C50919 Malignant neoplasm of unspecified site of unspecified female breast: Secondary | ICD-10-CM | POA: Diagnosis not present

## 2018-11-02 ENCOUNTER — Other Ambulatory Visit: Payer: Self-pay | Admitting: Family Medicine

## 2018-11-02 DIAGNOSIS — C50919 Malignant neoplasm of unspecified site of unspecified female breast: Secondary | ICD-10-CM | POA: Diagnosis not present

## 2018-11-02 DIAGNOSIS — M62838 Other muscle spasm: Secondary | ICD-10-CM

## 2018-11-04 DIAGNOSIS — C50919 Malignant neoplasm of unspecified site of unspecified female breast: Secondary | ICD-10-CM | POA: Diagnosis not present

## 2018-11-04 NOTE — Telephone Encounter (Signed)
F/u 10/01/18 #30 with no rf Last o/v 10/01/18 F/u 12/31/17

## 2018-11-07 DIAGNOSIS — C50919 Malignant neoplasm of unspecified site of unspecified female breast: Secondary | ICD-10-CM | POA: Diagnosis not present

## 2018-11-09 DIAGNOSIS — C50919 Malignant neoplasm of unspecified site of unspecified female breast: Secondary | ICD-10-CM | POA: Diagnosis not present

## 2018-11-10 DIAGNOSIS — C50919 Malignant neoplasm of unspecified site of unspecified female breast: Secondary | ICD-10-CM | POA: Diagnosis not present

## 2018-12-03 ENCOUNTER — Other Ambulatory Visit: Payer: Self-pay | Admitting: Family Medicine

## 2018-12-03 DIAGNOSIS — M62838 Other muscle spasm: Secondary | ICD-10-CM

## 2018-12-12 ENCOUNTER — Other Ambulatory Visit: Payer: Self-pay | Admitting: Family Medicine

## 2018-12-12 DIAGNOSIS — M62838 Other muscle spasm: Secondary | ICD-10-CM

## 2018-12-12 NOTE — Telephone Encounter (Signed)
See message.

## 2018-12-13 MED ORDER — DIAZEPAM 10 MG PO TABS: 10.0000 mg | ORAL_TABLET | Freq: Every day | ORAL | 0 refills | Status: DC

## 2018-12-17 ENCOUNTER — Telehealth: Payer: Self-pay | Admitting: Family Medicine

## 2018-12-17 DIAGNOSIS — F902 Attention-deficit hyperactivity disorder, combined type: Secondary | ICD-10-CM

## 2018-12-17 MED ORDER — AMPHETAMINE-DEXTROAMPHETAMINE 20 MG PO TABS: 20.0000 mg | ORAL_TABLET | Freq: Two times a day (BID) | ORAL | 0 refills | Status: DC

## 2018-12-17 NOTE — Telephone Encounter (Signed)
Ok to call in refill?  

## 2018-12-17 NOTE — Addendum Note (Signed)
Addended by: Briscoe Deutscher R on: 12/17/2018 04:30 PM   Modules accepted: Orders

## 2018-12-17 NOTE — Telephone Encounter (Signed)
Called pt to reschedule appt from 2/18 and pt stated she will need a refill on her adderall  before her next appt now moved to 3/2. Please advise.

## 2018-12-31 ENCOUNTER — Ambulatory Visit: Payer: BLUE CROSS/BLUE SHIELD | Admitting: Family Medicine

## 2019-01-08 ENCOUNTER — Encounter: Payer: Self-pay | Admitting: Gastroenterology

## 2019-01-12 NOTE — Progress Notes (Signed)
Natasha Mcclain is a 53 y.o. female is here for follow up.  History of Present Illness:   HPI: Patient is tired today.  She has been working around the clock.  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  She is also been helping a friend to take care of her dog.  Medications previously started, including Valium for muscle spasms and Adderall for hypersomnia have been helpful.  Focus is improved.  Previous plantar warts on the right foot have resolved since cryotherapy.  Unfortunately, the left foot still has 2 metatarsal plantar warts.  She would like this frozen again today.  History of eczema and worsened recently due to excessive handwashing.  Feet are improved after using Lac-Hydrin.  She has multiple upcoming appointments with specialists both at home and [facility].  She is worried about the overwhelming cost of all of these.  She has already applied to charity care for [facility].  Health Maintenance Due  Topic Date Due  . PAP SMEAR-Modifier  03/08/2019   Depression screen [facility] 2/9 01/13/2019 08/12/2018  Decreased Interest 0 0  Down, Depressed, Hopeless 1 0  PHQ - 2 Score 1 0  Altered sleeping 0 -  Tired, decreased energy 1 -  Change in appetite 1 -  Feeling bad or failure about yourself  0 -  Trouble concentrating 1 -  Moving slowly or fidgety/restless 0 -  Suicidal thoughts 0 -  PHQ-9 Score 4 -  Difficult doing work/chores Not difficult at all -   PMHx, SurgHx, SocialHx, FamHx, Medications, and Allergies were reviewed in the Visit Navigator and updated as appropriate.   Patient Active Problem List   Diagnosis Date Noted  . Attention deficit hyperactivity disorder (ADHD), combined type 01/13/2019  . Seasonal allergies, on Zyrtec and Flonase 01/13/2019  . Muscle spasms of neck 01/13/2019  . Closed displaced fracture of proximal phalanx of left thumb 01/11/2016  . Gamekeeper's thumb, left 01/07/2016  . Breast cancer ([place]) 08/13/2013  . 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 Absence of menstruation 06/20/2011  . Ovarian cyst 01/06/2011  . Mixed emotional features as adjustment reaction 12/16/2010   Social History   Tobacco Use  . Smoking status: Never Smoker  . Smokeless tobacco: Never Used  Substance Use Topics  . Alcohol use: Yes    Comment: Social   . Drug use: Not Currently   Current Medications and Allergies:   .  ammonium lactate (LAC-HYDRIN) 12 % cream, Apply topically as needed for dry skin., Disp: 385 g, Rfl: 0 .  amphetamine-dextroamphetamine (ADDERALL) 20 MG tablet, Take 1 tablet (20 mg total) by mouth 2 (two) times daily for 30 days., Disp: 60 tablet, Rfl: 0 .  Cetirizine HCl 10 MG CAPS, Take 1 capsule (10 mg total) by mouth daily., Disp: 30 capsule, Rfl: 6 .  diazepam (VALIUM) 10 MG tablet, Take 1 tablet (10 mg total) by mouth daily., Disp: 30 tablet, Rfl: 2 .  fluticasone (FLONASE) 50 MCG/ACT nasal spray, 2 sprays by Each Nare route daily., Disp: 16 g, Rfl: 3 .  venlafaxine XR (EFFEXOR-XR) 37.5 MG 24 hr capsule, Take 37.5 mg by mouth daily with breakfast., Disp: , Rfl:  .  amphetamine-dextroamphetamine (ADDERALL) 20 MG tablet, Take 1 tablet (20 mg total) by mouth 2 (two) times daily., Disp: 60 tablet, Rfl: 0 .  amphetamine-dextroamphetamine (ADDERALL) 20 MG tablet, Take 1 tablet (20 mg total) by mouth 2 (two) times daily., Disp: 60 tablet,  Rfl: 0 .  amphetamine-dextroamphetamine (ADDERALL) 20 MG tablet, Take 1 tablet (20 mg total) by mouth 2 (two) times daily., Disp: 60 tablet, Rfl: 0   Allergies  Allergen Reactions  . Paclitaxel Anaphylaxis and Shortness Of Breath   Review of Systems   Pertinent items are noted in the HPI. Otherwise, ROS is negative.  Vitals:   Vitals:   01/13/19 1312  BP: 120/78  Pulse: 83  Temp: 98.8 F (37.1 C)  TempSrc: Oral  SpO2: 96%  Weight: 166 lb 9.6 oz (75.6 kg)  Height: 5\' 9"  (1.753 m)     Body mass index is 24.6 kg/m.  Physical Exam:   Physical  Exam Vitals signs and nursing note reviewed.  Constitutional:      General: She is not in acute distress.    Appearance: She is well-developed.  HENT:     Head: Normocephalic and atraumatic.     Right Ear: External ear normal.     Left Ear: External ear normal.     Nose: Nose normal.  Eyes:     Conjunctiva/sclera: Conjunctivae normal.     Pupils: Pupils are equal, round, and reactive to light.  Neck:     Musculoskeletal: Normal range of motion and neck supple.     Thyroid: No thyromegaly.  Cardiovascular:     Rate and Rhythm: Normal rate and regular rhythm.     Heart sounds: Normal heart sounds.  Pulmonary:     Effort: Pulmonary effort is normal.     Breath sounds: Normal breath sounds.  Abdominal:     General: Bowel sounds are normal.     Palpations: Abdomen is soft.  Musculoskeletal: Normal range of motion.  Lymphadenopathy:     Cervical: No cervical adenopathy.  Skin:    General: Skin is warm and dry.     Capillary Refill: Capillary refill takes less than 2 seconds.     Comments: Plantar warts left foot x 2.  Neurological:     Mental Status: She is alert and oriented to person, place, and time.  Psychiatric:        Behavior: Behavior normal.    Assessment and Plan:   Elowyn was seen today for follow-up.  Diagnoses and all orders for this visit:  Attention deficit hyperactivity disorder (ADHD), combined type -     amphetamine-dextroamphetamine (ADDERALL) 20 MG tablet; Take 1 tablet (20 mg total) by mouth 2 (two) times daily. -     amphetamine-dextroamphetamine (ADDERALL) 20 MG tablet; Take 1 tablet (20 mg total) by mouth 2 (two) times daily. -     amphetamine-dextroamphetamine (ADDERALL) 20 MG tablet; Take 1 tablet (20 mg total) by mouth 2 (two) times daily.  Muscle spasms of neck -     diazepam (VALIUM) 10 MG tablet; Take 1 tablet (10 mg total) by mouth daily.  Seasonal allergies, on Zyrtec and Flonase -     fluticasone (FLONASE) 50 MCG/ACT nasal spray; 2 sprays  by Each Nare route daily.  Mixed emotional features as adjustment reaction  Other orders -     Discontinue: amphetamine-dextroamphetamine (ADDERALL) 20 MG tablet; Take 1 tablet (20 mg total) by mouth 2 (two) times daily.   Meds ordered this encounter  Medications  . amphetamine-dextroamphetamine (ADDERALL) 20 MG tablet    Sig: Take 1 tablet (20 mg total) by mouth 2 (two) times daily.    Dispense:  60 tablet    Refill:  0  . DISCONTD: amphetamine-dextroamphetamine (ADDERALL) 20 MG tablet    Sig:  Take 1 tablet (20 mg total) by mouth 2 (two) times daily.    Dispense:  60 tablet    Refill:  0  . amphetamine-dextroamphetamine (ADDERALL) 20 MG tablet    Sig: Take 1 tablet (20 mg total) by mouth 2 (two) times daily.    Dispense:  60 tablet    Refill:  0  . diazepam (VALIUM) 10 MG tablet    Sig: Take 1 tablet (10 mg total) by mouth daily.    Dispense:  30 tablet    Refill:  2    Not to exceed 5 additional fills before 05/04/2019 DX Code Needed  .  . fluticasone (FLONASE) 50 MCG/ACT nasal spray    Sig: 2 sprays by Each Nare route daily.    Dispense:  16 g    Refill:  3  . amphetamine-dextroamphetamine (ADDERALL) 20 MG tablet    Sig: Take 1 tablet (20 mg total) by mouth 2 (two) times daily.    Dispense:  60 tablet    Refill:  0    . Reviewed expectations re: course of current medical issues. . Discussed self-management of symptoms. . Outlined signs and symptoms indicating need for more acute intervention. . Patient verbalized understanding and all questions were answered. Marland Kitchen Health Maintenance issues including appropriate healthy diet, exercise, and smoking avoidance were discussed with patient. . See orders for this visit as documented in the electronic medical record. . Patient received an After Visit Summary.  Briscoe Deutscher, DO [place], [facility] 01/13/2019

## 2019-01-13 ENCOUNTER — Encounter: Payer: Self-pay | Admitting: Family Medicine

## 2019-01-13 ENCOUNTER — Other Ambulatory Visit: Payer: Self-pay

## 2019-01-13 ENCOUNTER — Ambulatory Visit (AMBULATORY_SURGERY_CENTER): Payer: Self-pay | Admitting: *Deleted

## 2019-01-13 ENCOUNTER — Ambulatory Visit: Payer: BLUE CROSS/BLUE SHIELD | Admitting: Family Medicine

## 2019-01-13 VITALS — BP 120/78 | HR 83 | Temp 98.8°F | Ht 69.0 in | Wt 166.6 lb

## 2019-01-13 VITALS — Ht 69.0 in | Wt 167.0 lb

## 2019-01-13 DIAGNOSIS — J302 Other seasonal allergic rhinitis: Secondary | ICD-10-CM | POA: Diagnosis not present

## 2019-01-13 DIAGNOSIS — F902 Attention-deficit hyperactivity disorder, combined type: Secondary | ICD-10-CM

## 2019-01-13 DIAGNOSIS — F4329 Adjustment disorder with other symptoms: Secondary | ICD-10-CM | POA: Diagnosis not present

## 2019-01-13 DIAGNOSIS — M62838 Other muscle spasm: Secondary | ICD-10-CM | POA: Insufficient documentation

## 2019-01-13 DIAGNOSIS — Z1211 Encounter for screening for malignant neoplasm of colon: Secondary | ICD-10-CM

## 2019-01-13 MED ORDER — FLUTICASONE PROPIONATE 50 MCG/ACT NA SUSP: NASAL | 3 refills | Status: DC

## 2019-01-13 MED ORDER — SUPREP BOWEL PREP KIT 17.5-3.13-1.6 GM/177ML PO SOLN: 1.0000 | Freq: Once | ORAL | 0 refills | Status: AC

## 2019-01-13 MED ORDER — AMPHETAMINE-DEXTROAMPHETAMINE 20 MG PO TABS: 20.0000 mg | ORAL_TABLET | Freq: Two times a day (BID) | ORAL | 0 refills | Status: DC

## 2019-01-13 MED ORDER — DIAZEPAM 10 MG PO TABS: 10.0000 mg | ORAL_TABLET | Freq: Every day | ORAL | 2 refills | Status: DC

## 2019-01-13 NOTE — Progress Notes (Signed)
No egg or soy allergy known to patient  No issues with past sedation with any surgeries  or procedures, no intubation problems  No diet pills per patient No home 02 use per patient  No blood thinners per patient  Pt denies issues with constipation  No A fib or A flutter  EMMI video sent to pt's e mail  suprep 50.00 coupon given NO STICKS OR BP ON LEFT

## 2019-01-14 ENCOUNTER — Encounter: Payer: Self-pay | Admitting: Gastroenterology

## 2019-01-20 ENCOUNTER — Other Ambulatory Visit: Payer: Self-pay

## 2019-01-20 ENCOUNTER — Encounter: Payer: Self-pay | Admitting: Gastroenterology

## 2019-01-20 ENCOUNTER — Ambulatory Visit (AMBULATORY_SURGERY_CENTER): Payer: BLUE CROSS/BLUE SHIELD | Admitting: Gastroenterology

## 2019-01-20 VITALS — BP 124/76 | HR 65 | Temp 97.3°F | Resp 14 | Ht 69.0 in | Wt 167.0 lb

## 2019-01-20 DIAGNOSIS — K635 Polyp of colon: Secondary | ICD-10-CM | POA: Diagnosis not present

## 2019-01-20 DIAGNOSIS — D125 Benign neoplasm of sigmoid colon: Secondary | ICD-10-CM

## 2019-01-20 DIAGNOSIS — Z1211 Encounter for screening for malignant neoplasm of colon: Secondary | ICD-10-CM | POA: Diagnosis not present

## 2019-01-20 DIAGNOSIS — D122 Benign neoplasm of ascending colon: Secondary | ICD-10-CM

## 2019-01-20 DIAGNOSIS — D123 Benign neoplasm of transverse colon: Secondary | ICD-10-CM

## 2019-01-20 MED ORDER — SODIUM CHLORIDE 0.9 % IV SOLN: 500.0000 mL | Freq: Once | INTRAVENOUS | Status: DC

## 2019-01-20 NOTE — Patient Instructions (Signed)
Handouts Provided:  Polyps and Diverticulosis  YOU HAD AN ENDOSCOPIC PROCEDURE TODAY AT [facility]:   Refer to the procedure report that was given to you for any specific questions about what was found during the examination.  If the procedure report does not answer your questions, please call your gastroenterologist to clarify.  If you requested that your care partner not be given the details of your procedure findings, then the procedure report has been included in a sealed envelope for you to review at your convenience later.  YOU SHOULD EXPECT: Some feelings of bloating in the abdomen. Passage of more gas than usual.  Walking can help get rid of the air that was put into your GI tract during the procedure and reduce the bloating. If you had a lower endoscopy (such as a colonoscopy or flexible sigmoidoscopy) you may notice spotting of blood in your stool or on the toilet paper. If you underwent a bowel prep for your procedure, you may not have a normal bowel movement for a few days.  Please Note:  You might notice some irritation and congestion in your nose or some drainage.  This is from the oxygen used during your procedure.  There is no need for concern and it should clear up in a day or so.  SYMPTOMS TO REPORT IMMEDIATELY:   Following lower endoscopy (colonoscopy or flexible sigmoidoscopy):  Excessive amounts of blood in the stool  Significant tenderness or worsening of abdominal pains  Swelling of the abdomen that is new, acute  Fever of 100F or higher  For urgent or emergent issues, a gastroenterologist can be reached at any hour by calling [phone].   DIET:  We do recommend a small meal at first, but then you may proceed to your regular diet.  Drink plenty of fluids but you should avoid alcoholic beverages for 24 hours.  ACTIVITY:  You should plan to take it easy for the rest of today and you should NOT DRIVE or use heavy machinery until tomorrow (because of  the sedation medicines used during the test).    FOLLOW UP: Our staff will call the number listed on your records the next business day following your procedure to check on you and address any questions or concerns that you may have regarding the information given to you following your procedure. If we do not reach you, we will leave a message.  However, if you are feeling well and you are not experiencing any problems, there is no need to return our call.  We will assume that you have returned to your regular daily activities without incident.  If any biopsies were taken you will be contacted by phone or by letter within the next 1-3 weeks.  Please call us at [phone] if you have not heard about the biopsies in 3 weeks.    SIGNATURES/CONFIDENTIALITY: You and/or your care partner have signed paperwork which will be entered into your electronic medical record.  These signatures attest to the fact that that the information above on your After Visit Summary has been reviewed and is understood.  Full responsibility of the confidentiality of this discharge information lies with you and/or your care-partner.

## 2019-01-20 NOTE — Progress Notes (Signed)
Called to room to assist during endoscopic procedure.  Patient ID and intended procedure confirmed with present staff. Received instructions for my participation in the procedure from the performing physician.  

## 2019-01-20 NOTE — Op Note (Signed)
[place] Patient Name: Natasha Mcclain Procedure Date: 01/20/2019 7:28 AM MRN: [id number] Endoscopist: Mauri Pole , MD Age: 53 Referring MD:  Date of Birth: [birth year] Gender: Female Account #: [id number] Procedure:                Colonoscopy Indications:              Screening for colorectal malignant neoplasm Medicines:                Monitored Anesthesia Care Procedure:                Pre-Anesthesia Assessment:                           - Prior to the procedure, a History and Physical                            was performed, and patient medications and                            allergies were reviewed. The patient's tolerance of                            previous anesthesia was also reviewed. The risks                            and benefits of the procedure and the sedation                            options and risks were discussed with the patient.                            All questions were answered, and informed consent                            was obtained. Prior Anticoagulants: The patient has                            taken no previous anticoagulant or antiplatelet                            agents. ASA Grade Assessment: II - A patient with                            mild systemic disease. After reviewing the risks                            and benefits, the patient was deemed in                            satisfactory condition to undergo the procedure.                           After obtaining informed consent, the colonoscope  was passed under direct vision. Throughout the                            procedure, the patient's blood pressure, pulse, and                            oxygen saturations were monitored continuously. The                            Colonoscope was introduced through the anus and                            advanced to the the cecum, identified by                            appendiceal orifice and  ileocecal valve. The                            colonoscopy was performed without difficulty. The                            patient tolerated the procedure well. The quality                            of the bowel preparation was adequate. The                            ileocecal valve, appendiceal orifice, and rectum                            were photographed. Scope In: 8:10:35 AM Scope Out: 8:32:20 AM Scope Withdrawal Time: 0 hours 15 minutes 52 seconds  Total Procedure Duration: 0 hours 21 minutes 45 seconds  Findings:                 The perianal and digital rectal examinations were                            normal.                           A 5 mm polyp was found in the transverse colon. The                            polyp was sessile. The polyp was removed with a                            cold snare. Resection and retrieval were complete.                           Four sessile polyps were found in the sigmoid colon                            and ascending colon. The polyps were 1 to 2 mm in  size. These polyps were removed with a cold biopsy                            forceps. Resection and retrieval were complete.                           A few small-mouthed diverticula were found in the                            sigmoid colon.                           Non-bleeding internal hemorrhoids were found during                            retroflexion. The hemorrhoids were small. Complications:            No immediate complications. Estimated Blood Loss:     Estimated blood loss was minimal. Impression:               - One 5 mm polyp in the transverse colon, removed                            with a cold snare. Resected and retrieved.                           - Four 1 to 2 mm polyps in the sigmoid colon and in                            the ascending colon, removed with a cold biopsy                            forceps. Resected and retrieved.                            - Diverticulosis in the sigmoid colon.                           - Non-bleeding internal hemorrhoids. Recommendation:           - Patient has a contact number available for                            emergencies. The signs and symptoms of potential                            delayed complications were discussed with the                            patient. Return to normal activities tomorrow.                            Written discharge instructions were provided to the  patient.                           - Resume previous diet.                           - Continue present medications.                           - Await pathology results.                           - Repeat colonoscopy in 3 - 5 years for                            surveillance based on pathology results. Mauri Pole, MD 01/20/2019 8:36:03 AM This report has been signed electronically.

## 2019-01-20 NOTE — Progress Notes (Signed)
Pt's states no medical or surgical changes since previsit or office visit. 

## 2019-01-20 NOTE — Progress Notes (Signed)
A and O x3. Report to RN. Tolerated MAC anesthesia well.

## 2019-01-21 ENCOUNTER — Telehealth: Payer: Self-pay | Admitting: *Deleted

## 2019-01-21 NOTE — Telephone Encounter (Signed)
Left message on 2nd f/u call 

## 2019-01-21 NOTE — Telephone Encounter (Signed)
First attempt, left VM.  

## 2019-01-27 ENCOUNTER — Encounter: Payer: Self-pay | Admitting: Gastroenterology

## 2019-03-07 ENCOUNTER — Encounter: Payer: Self-pay | Admitting: Sports Medicine

## 2019-03-07 ENCOUNTER — Other Ambulatory Visit: Payer: Self-pay

## 2019-03-07 ENCOUNTER — Ambulatory Visit (INDEPENDENT_AMBULATORY_CARE_PROVIDER_SITE_OTHER): Payer: BLUE CROSS/BLUE SHIELD | Admitting: Sports Medicine

## 2019-03-07 ENCOUNTER — Ambulatory Visit: Payer: BLUE CROSS/BLUE SHIELD | Admitting: Family Medicine

## 2019-03-07 ENCOUNTER — Ambulatory Visit (INDEPENDENT_AMBULATORY_CARE_PROVIDER_SITE_OTHER): Payer: BLUE CROSS/BLUE SHIELD

## 2019-03-07 ENCOUNTER — Telehealth: Payer: Self-pay

## 2019-03-07 VITALS — BP 140/90 | HR 90 | Temp 98.2°F | Ht 69.0 in

## 2019-03-07 DIAGNOSIS — M25572 Pain in left ankle and joints of left foot: Secondary | ICD-10-CM

## 2019-03-07 DIAGNOSIS — F4329 Adjustment disorder with other symptoms: Secondary | ICD-10-CM

## 2019-03-07 DIAGNOSIS — M7989 Other specified soft tissue disorders: Secondary | ICD-10-CM | POA: Diagnosis not present

## 2019-03-07 DIAGNOSIS — S82892A Other fracture of left lower leg, initial encounter for closed fracture: Secondary | ICD-10-CM

## 2019-03-07 MED ORDER — IBUPROFEN-FAMOTIDINE 800-26.6 MG PO TABS: 1.0000 | ORAL_TABLET | Freq: Three times a day (TID) | ORAL | 2 refills | Status: DC | PRN

## 2019-03-07 MED ORDER — IBUPROFEN-FAMOTIDINE 800-26.6 MG PO TABS: 1.0000 | ORAL_TABLET | Freq: Three times a day (TID) | ORAL | 0 refills | Status: AC | PRN

## 2019-03-07 MED ORDER — TRAMADOL HCL 50 MG PO TABS: 50.0000 mg | ORAL_TABLET | Freq: Four times a day (QID) | ORAL | 0 refills | Status: DC | PRN

## 2019-03-07 NOTE — Progress Notes (Signed)
Natasha Mcclain. Natasha Mcclain, Natasha Mcclain at [place]  [name] - 53 y.o. female MRN [id number]  Date of birth: 1966-07-12  Visit Date: 03/07/2019  PCP: Natasha Deutscher, DO   Referred by: Natasha Deutscher, DO  SUBJECTIVE:  Chief Complaint  Patient presents with  . New Patient (Initial Visit)    L ankle pain    HPI: Patient reports having an inversion injury last night while taking her dog out on the deck.  She slipped in the dark and had an issue with immediate pain, swelling and difficulty bearing weight.  She reports the pain is a 8 out of 10 at rest.  Pain is worse over the medial lateral ankle.  She had immediate pain and swelling.  She has having difficulty with walking and weightbearing.  At rest the pain is moderate.  Arnica gel and topical steroid gel for her eczema has provided only minimal improvement.  She did put a compression wrap on it however it did seem to exacerbate this.  REVIEW OF SYSTEMS: Night time disturbances: Reports Fevers, chills and night sweats: Denies Unexplained weight loss: Denies Personal history of cancer: Reports, left breast Changes in bowel or bladder habits: Denies Recent unreported falls: Reports, fall last night as above New or worsening dyspnea or wheezing: Denies Headaches and dizziness: Denies Numbness, tingling and weakness in the extremities: Denies Dizziness or presyncopal episodes: Denies Lower extremity edema: Reports, injury related  HISTORY:  Prior history reviewed and updated per electronic medical record.  Patient Active Problem List   Diagnosis Date Noted  . Attention deficit hyperactivity disorder (ADHD), combined type 01/13/2019  . Seasonal allergies, on Zyrtec and Flonase 01/13/2019  . Muscle spasms of neck 01/13/2019  . Closed displaced fracture of proximal phalanx of left thumb 01/11/2016  . Gamekeeper's thumb, left 01/07/2016  . Breast cancer ([place]) 08/13/2013    Followed  By [facility] Multifocal T1cN1 Left sided Breast cancer, which is ER/PR positive, HER-2/neu negative. She is status postmastectomy with tissue expander placement 08/2008. She received 12 weeks of weekly Abraxane after having a significant infusion reaction to Taxol. She then completed dose-dense ACx4 in 03/2009 followed by post-mastectomy radiation therapy and tamoxifen in 05/2009    . 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 Absence of menstruation 06/20/2011  . Ovarian cyst 01/06/2011  . Mixed emotional features as adjustment reaction 12/16/2010   Social History   Occupational History  . Not on file  Tobacco Use  . Smoking status: Never Smoker  . Smokeless tobacco: Never Used  Substance and Sexual Activity  . Alcohol use: Yes    Comment: Social   . Drug use: Not Currently  . Sexual activity: Not Currently   Social History   Social History Narrative  . Not on file   Past Medical History:  Diagnosis Date  . Allergy   . Arthritis   . Breast cancer ([place])   . Depression   . Genital warts    Past Surgical History:  Procedure Laterality Date  . BREAST BIOPSY  2009  . BREAST RECONSTRUCTION Left 2010  . breast reduction on right    . MASTECTOMY Left    family history includes Cancer in her mother and sister; Colon cancer in her cousin; Diabetes in her brother and mother; Early death in her father and mother; Hypertension in her sister; Miscarriages / Stillbirths in her mother. There is no history of Colon polyps, Esophageal cancer, Rectal  cancer, or Stomach cancer.  OBJECTIVE:  VS:  HT:5' 9"  (175.3 cm)   WT:(Not taken due to being on crutches)  BMI:     BP:140/90  HR:90bpm  TEMP:98.2 F (36.8 C)( )  RESP:97 %   PHYSICAL EXAM: CONSTITUTIONAL: Well-developed, Well-nourished and In no acute distress EYES: Pupils are equal., EOM intact without nystagmus. and No scleral icterus. Psychiatric: Alert & appropriately interactive. and Not depressed or anxious  appearing. EXTREMITY EXAM: Warm and well perfused  Left ankle is swollen across the dorsum of the ankle.  She has focal tenderness along the anterior talus, medial and lateral malleolus both posterior and anterior.  She has no focal pain over the base of the fifth metatarsal or navicular bones.  Her inversion, eversion, plantarflexion, dorsiflexion strength is intact although markedly limited range of motion due to the swelling.  DP PT pulses are intact and 2+/4.   ASSESSMENT:  1. Acute left ankle pain   2. Avulsion fracture of ankle, left, closed, initial encounter     PROCEDURES:  None  PLAN:  Pertinent additional documentation may be included in corresponding procedure notes, imaging studies, problem based documentation and patient instructions.  No problem-specific Assessment & Plan notes found for this encounter.   Boot immobilization, anti-inflammatories, rest, elevation.   Home Therapeutic Exercises: Gentle dorsiflexion and plantarflexion range of motion only while out of the boot was discussed but otherwise no focal or specific exercises   Activity modifications and the importance of avoiding exacerbating activities (limiting pain to no more than a 4 / 10 during or following activity) recommended and discussed.   Discussed red flag symptoms that warrant earlier emergent evaluation and patient voices understanding.    Meds ordered this encounter  Medications  . Ibuprofen-Famotidine (DUEXIS) 800-26.6 MG TABS    Sig: Take 1 tablet by mouth 3 (three) times daily as needed. 1 tab po tid X 14 days then 1 tab po tid as needed    Dispense:  90 tablet    Refill:  2    Home Phone      [phone] Mobile          [phone]   . Ibuprofen-Famotidine (DUEXIS) 800-26.6 MG TABS    Sig: Take 1 tablet by mouth 3 (three) times daily as needed for up to 1 day.    Dispense:  9 tablet    Refill:  0  . traMADol (ULTRAM) 50 MG tablet    Sig: Take 1 tablet (50 mg total) by mouth every 6  (six) hours as needed for up to 5 days for moderate pain.    Dispense:  20 tablet    Refill:  0   Lab Orders  No laboratory test(s) ordered today    Imaging Orders     DG Ankle Complete Left Referral Orders  No referral(s) requested today     Return in about 2 weeks (around 03/21/2019).  Plan for repeat x-rays at that time.         Gerda Diss, [place] [facility] Physician

## 2019-03-07 NOTE — Telephone Encounter (Signed)
Okay Rx but make sure she has f/u scheduled.

## 2019-03-07 NOTE — Patient Instructions (Addendum)
Instructions for Duexis, Pennsaid and Vimovo:  Your prescription will be filled through a participating [facility] mail order pharmacy.  You will receive a phone call or text from one of the participating pharmacies which can be located in any state in the [place].  You must communicate directly with them to have this medication filled.  When the pharmacy contacts you, they will need your mailing address (for shipment of the medication) andy they will need payment information if you have a copay (typically no more than $10). If you have not heard from them 2-3 days after your appointment with Dr. Paulla Fore, contact [facility] directly at [phone].   Wear your boot around-the-clock.  You do need to sleep in it. If you are laying on the couch and have your foot elevated it is okay to temporarily remove your boot.   Frequent icing is recommended.  You can ice for 10-15 minutes at a time 3-4 times per day if needed.  It is important to be sure to ice immediately after activity. For the foot/ankle and hand/wrist it is sometimes easier and more effective to soak in a bucket of cool water.   The water should not be miserably cold, a good rule to go by his if there is any ice floating by the end of the 10-15 minutes it was likely too cold.

## 2019-03-07 NOTE — Telephone Encounter (Signed)
Patient was in office to see Natasha Mcclain sent message to [place] wanted refill on Effexor for 90 day called in. We have not ever given ok to refill

## 2019-03-10 ENCOUNTER — Telehealth: Payer: Self-pay | Admitting: Family Medicine

## 2019-03-10 MED ORDER — VENLAFAXINE HCL ER 37.5 MG PO CP24: 37.5000 mg | ORAL_CAPSULE | Freq: Every day | ORAL | 0 refills | Status: DC

## 2019-03-10 NOTE — Telephone Encounter (Signed)
Please advise 

## 2019-03-10 NOTE — Telephone Encounter (Signed)
Yes, okay to take together.

## 2019-03-10 NOTE — Telephone Encounter (Signed)
Refill sent in please call patient to make f/u

## 2019-03-10 NOTE — Telephone Encounter (Signed)
Called pt and left vm to schedule

## 2019-03-10 NOTE — Addendum Note (Signed)
Addended by: Francella Solian on: 03/10/2019 12:37 PM   Modules accepted: Orders

## 2019-03-10 NOTE — Telephone Encounter (Signed)
Patient called in and wants to make sure its ok for her to take the traMADol (ULTRAM) 50 MG tablet and the Ibuprofen-Famotidine (DUEXIS) 800-26.6 MG TABS together. Patient would like a call back.

## 2019-03-11 ENCOUNTER — Other Ambulatory Visit: Payer: Self-pay

## 2019-03-11 ENCOUNTER — Encounter: Payer: Self-pay | Admitting: Family Medicine

## 2019-03-11 ENCOUNTER — Ambulatory Visit (INDEPENDENT_AMBULATORY_CARE_PROVIDER_SITE_OTHER): Payer: BLUE CROSS/BLUE SHIELD | Admitting: Family Medicine

## 2019-03-11 ENCOUNTER — Encounter: Payer: Self-pay | Admitting: Sports Medicine

## 2019-03-11 DIAGNOSIS — S82892D Other fracture of left lower leg, subsequent encounter for closed fracture with routine healing: Secondary | ICD-10-CM | POA: Diagnosis not present

## 2019-03-11 MED ORDER — OXYCODONE-ACETAMINOPHEN 5-325 MG PO TABS: 1.0000 | ORAL_TABLET | ORAL | 0 refills | Status: DC | PRN

## 2019-03-11 NOTE — Patient Instructions (Signed)
You will take Ibuprofen 800mg  three times a day Pepcid twice a day. One in the morning and one at night Percocet one tab every 4-6 hrs as needed for pain.   Homemade gel ice packs These homemade gel ice packs are more comfortable than a bag of frozen peas, because they mold better to your body without the lumps and bumps. They can be made for under $3.  What you need: . 1 quart or 1 gallon plastic freezer bags (depending on how large you want the cold pack)  . 2 cups water  . 1 cup rubbing alcohol Instructions: 1. Fill the plastic freezer bag with 1 cup of rubbing alcohol and 2 cups of water. 2. Try to get as much air out of the freezer bag before sealing it shut. 3. Place the bag and its contents inside a second freezer bag to contain any leakage. 4. Leave the bag in the freezer for at least an hour. 5. When it's ready, place a towel between the gel pack and bare skin to avoid burning the skin. An alternative filler is simply to use dish soap, which has a gel-like consistency and will also freeze/retain the cold.

## 2019-03-11 NOTE — Progress Notes (Signed)
Virtual Visit via Video   Due to the COVID-19 pandemic, this visit was completed with telemedicine (audio/video) technology to reduce patient and provider exposure as well as to preserve personal protective equipment.   I connected with Natasha Mcclain by a video enabled telemedicine application and verified that I am speaking with the correct person using two identifiers. Location patient: Home Location provider: [facility], Office Persons participating in the virtual visit: Haili, Donofrio, DO Lonell Grandchild, CMA acting as scribe for Dr. Briscoe Deutscher.   I discussed the limitations of evaluation and management by telemedicine and the availability of in person appointments. The patient expressed understanding and agreed to proceed.  Care Team   Patient Care Team: Briscoe Deutscher, DO as PCP - General (Family Medicine)  Subjective:   HPI: Patient diagnosed with ankle fracture last week. She has been taking Duexis. She is not able to take tramadol due to side effects. She lives alone.  Pain is worse when trying to get around her house.  She is moderate to severe.  ICE able every hour.  Keeping elevated.  Review of Systems  Constitutional: Negative for chills and fever.  HENT: Negative for hearing loss and tinnitus.   Eyes: Negative for blurred vision.  Respiratory: Negative for cough.   Cardiovascular: Negative for chest pain.  Gastrointestinal: Negative for heartburn and nausea.  Genitourinary: Negative for dysuria and urgency.  Musculoskeletal: Negative for myalgias.  Skin: Negative for rash.  Neurological: Negative for dizziness.  Endo/Heme/Allergies: Does not bruise/bleed easily.  Psychiatric/Behavioral: Negative for depression.     Patient Active Problem List   Diagnosis Date Noted  . Attention deficit hyperactivity disorder (ADHD), combined type 01/13/2019  . Seasonal allergies, on Zyrtec and Flonase 01/13/2019  . Muscle spasms of neck 01/13/2019  . Closed  displaced fracture of proximal phalanx of left thumb 01/11/2016  . Gamekeeper's thumb, left 01/07/2016  . Breast cancer ([place]) 08/13/2013  . 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 Absence of menstruation 06/20/2011  . Ovarian cyst 01/06/2011  . Mixed emotional features as adjustment reaction 12/16/2010    Social History   Tobacco Use  . Smoking status: Never Smoker  . Smokeless tobacco: Never Used  Substance Use Topics  . Alcohol use: Yes    Comment: Social     Current Outpatient Medications:  .  ammonium lactate (LAC-HYDRIN) 12 % cream, Apply topically as needed for dry skin. (Patient not taking: Reported on 03/07/2019), Disp: 385 g, Rfl: 0 .  amphetamine-dextroamphetamine (ADDERALL) 20 MG tablet, Take 1 tablet (20 mg total) by mouth 2 (two) times daily., Disp: 60 tablet, Rfl: 0 .  amphetamine-dextroamphetamine (ADDERALL) 20 MG tablet, Take 1 tablet (20 mg total) by mouth 2 (two) times daily. (Patient not taking: Reported on 03/07/2019), Disp: 60 tablet, Rfl: 0 .  Cetirizine HCl 10 MG CAPS, Take 1 capsule (10 mg total) by mouth daily., Disp: 30 capsule, Rfl: 6 .  diazepam (VALIUM) 10 MG tablet, Take 1 tablet (10 mg total) by mouth daily., Disp: 30 tablet, Rfl: 2 .  fluticasone (FLONASE) 50 MCG/ACT nasal spray, 2 sprays by Each Nare route daily., Disp: 16 g, Rfl: 3 .  Ibuprofen-Famotidine (DUEXIS) 800-26.6 MG TABS, Take 1 tablet by mouth 3 (three) times daily as needed. 1 tab po tid X 14 days then 1 tab po tid as needed, Disp: 90 tablet, Rfl: 2 .  traMADol (ULTRAM) 50 MG tablet, Take 1 tablet (50 mg total) by mouth  every 6 (six) hours as needed for up to 5 days for moderate pain., Disp: 20 tablet, Rfl: 0 .  venlafaxine XR (EFFEXOR-XR) 37.5 MG 24 hr capsule, Take 1 capsule (37.5 mg total) by mouth daily with breakfast., Disp: 90 capsule, Rfl: 0  Allergies  Allergen Reactions  . Paclitaxel Anaphylaxis and Shortness Of Breath  . Tramadol Other (See Comments)     Nightmares and nausea and  vomiting    . Tape Rash    Objective:   VITALS: Per patient if applicable, see vitals. GENERAL: Alert, appears well and in no acute distress. HEENT: Atraumatic, conjunctiva clear, no obvious abnormalities on inspection of external nose and ears. NECK: Normal movements of the head and neck. CARDIOPULMONARY: No increased WOB. Speaking in clear sentences. I:E ratio WNL.  MS: Left ankle swollen and bruised. PSYCH: Pleasant and cooperative, well-groomed. Speech normal rate and rhythm. Affect is appropriate. Insight and judgement are appropriate. Attention is focused, linear, and appropriate.  NEURO: CN grossly intact. Oriented as arrived to appointment on time with no prompting. Moves both UE equally.  SKIN: No obvious lesions, wounds, erythema, or cyanosis noted on face or hands.  Depression screen [facility] 2/9 01/13/2019 08/12/2018  Decreased Interest 0 0  Down, Depressed, Hopeless 1 0  PHQ - 2 Score 1 0  Altered sleeping 0 -  Tired, decreased energy 1 -  Change in appetite 1 -  Feeling bad or failure about yourself  0 -  Trouble concentrating 1 -  Moving slowly or fidgety/restless 0 -  Suicidal thoughts 0 -  PHQ-9 Score 4 -  Difficult doing work/chores Not difficult at all -    Assessment and Plan:   Natasha Mcclain was seen today for follow-up.  Diagnoses and all orders for this visit:  Closed avulsion fracture of left ankle with routine healing, subsequent encounter -     oxyCODONE-acetaminophen (PERCOCET/ROXICET) 5-325 MG tablet; Take 1 tablet by mouth every 4 (four) hours as needed for severe pain.  Pain is uncontrolled.  She will continue with ibuprofen and omeprazole.  Will add oxycodone every 4 hours PRN for severe breakthrough pain.  She will continue to ice a few times a day.  Discussed lab.  She has ordered a knee scooter and is expecting it soon.  Will follow up with sports medicine.  Marland Kitchen COVID-19 Education:The signs and symptoms of COVID-19 were discussed  with the patient and how to seek care for testing if needed. The importance of social distancing was discussed today. . Reviewed expectations re: course of current medical issues. . Discussed self-management of symptoms. . Outlined signs and symptoms indicating need for more acute intervention. . Patient verbalized understanding and all questions were answered. Marland Kitchen Health Maintenance issues including appropriate healthy diet, exercise, and smoking avoidance were discussed with patient. . See orders for this visit as documented in the electronic medical record.  Briscoe Deutscher, DO 03/15/2019  Records requested if needed. Time spent: 25 minutes, of which >50% was spent in obtaining information about her symptoms, reviewing her previous labs, evaluations, and treatments, counseling her about her condition (please see the discussed topics above), and developing a plan to further investigate it; she had a number of questions which I addressed.

## 2019-03-11 NOTE — Telephone Encounter (Signed)
Will d/w patient today at visit.

## 2019-03-12 NOTE — Telephone Encounter (Signed)
Patient seen 03/11/19

## 2019-03-15 ENCOUNTER — Encounter: Payer: Self-pay | Admitting: Family Medicine

## 2019-03-17 NOTE — Progress Notes (Signed)
Let's have her f/u with Dr. Tamala Julian.

## 2019-03-18 ENCOUNTER — Other Ambulatory Visit: Payer: Self-pay | Admitting: Physical Therapy

## 2019-03-18 DIAGNOSIS — M79672 Pain in left foot: Secondary | ICD-10-CM

## 2019-03-19 ENCOUNTER — Ambulatory Visit (INDEPENDENT_AMBULATORY_CARE_PROVIDER_SITE_OTHER): Payer: BLUE CROSS/BLUE SHIELD

## 2019-03-19 ENCOUNTER — Other Ambulatory Visit: Payer: BLUE CROSS/BLUE SHIELD

## 2019-03-19 ENCOUNTER — Other Ambulatory Visit: Payer: Self-pay | Admitting: Family Medicine

## 2019-03-19 ENCOUNTER — Other Ambulatory Visit: Payer: Self-pay

## 2019-03-19 ENCOUNTER — Telehealth: Payer: Self-pay | Admitting: Family Medicine

## 2019-03-19 DIAGNOSIS — M79672 Pain in left foot: Secondary | ICD-10-CM

## 2019-03-19 DIAGNOSIS — S82892D Other fracture of left lower leg, subsequent encounter for closed fracture with routine healing: Secondary | ICD-10-CM

## 2019-03-19 MED ORDER — OXYCODONE-ACETAMINOPHEN 5-325 MG PO TABS: 1.0000 | ORAL_TABLET | ORAL | 0 refills | Status: DC | PRN

## 2019-03-19 NOTE — Telephone Encounter (Signed)
Rx Request   Last filled 03/11/19  #[id number]    Next OV 03/24/19

## 2019-03-19 NOTE — Telephone Encounter (Signed)
Forwarding to Dr. Wallace to advise.  

## 2019-03-19 NOTE — Telephone Encounter (Signed)
Called pt and advised. Also went over radiologists interpretation of XR of foot done today.

## 2019-03-19 NOTE — Telephone Encounter (Signed)
Pt called and stated that she would run out of this medication before the appt she has scheduled for Friday May 8th and would like a refill.   oxyCODONE-acetaminophen (PERCOCET/ROXICET) 5-325 MG tablet  Patient is also requesting that Dr. Juleen China look at her new xrays that she had done this morning, 03/19/19, and contact her before her appt on Friday to let her know if there are any additional breaks in her foot/ankle.   Thanks!

## 2019-03-19 NOTE — Telephone Encounter (Signed)
Already sent to the pharmacy.

## 2019-03-21 ENCOUNTER — Ambulatory Visit: Payer: BLUE CROSS/BLUE SHIELD | Admitting: Family Medicine

## 2019-03-21 DIAGNOSIS — M25572 Pain in left ankle and joints of left foot: Secondary | ICD-10-CM | POA: Diagnosis not present

## 2019-03-21 DIAGNOSIS — S93402A Sprain of unspecified ligament of left ankle, initial encounter: Secondary | ICD-10-CM | POA: Diagnosis not present

## 2019-03-21 DIAGNOSIS — M79672 Pain in left foot: Secondary | ICD-10-CM | POA: Diagnosis not present

## 2019-03-21 NOTE — Progress Notes (Deleted)
Virtual Visit via Video   Due to the COVID-19 pandemic, this visit was completed with telemedicine (audio/video) technology to reduce patient and provider exposure as well as to preserve personal protective equipment.   I connected with Natasha Mcclain by a video enabled telemedicine application and verified that I am speaking with the correct person using two identifiers. Location patient: Home Location provider: [facility], Office Persons participating in the virtual visit: Ezabella, Teska, DO   I discussed the limitations of evaluation and management by telemedicine and the availability of in person appointments. The patient expressed understanding and agreed to proceed.  Care Team   Patient Care Team: Briscoe Deutscher, DO as PCP - General (Family Medicine)  Subjective:   HPI:   ROS   Patient Active Problem List   Diagnosis Date Noted  . Attention deficit hyperactivity disorder (ADHD), combined type 01/13/2019  . Seasonal allergies, on Zyrtec and Flonase 01/13/2019  . Muscle spasms of neck 01/13/2019  . Closed displaced fracture of proximal phalanx of left thumb 01/11/2016  . Gamekeeper's thumb, left 01/07/2016  . Breast cancer ([place]) 08/13/2013  . 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 Absence of menstruation 06/20/2011  . Ovarian cyst 01/06/2011  . Mixed emotional features as adjustment reaction 12/16/2010    Social History   Tobacco Use  . Smoking status: Never Smoker  . Smokeless tobacco: Never Used  Substance Use Topics  . Alcohol use: Yes    Comment: Social     Current Outpatient Medications:  .  ammonium lactate (LAC-HYDRIN) 12 % cream, Apply topically as needed for dry skin., Disp: 385 g, Rfl: 0 .  amphetamine-dextroamphetamine (ADDERALL) 20 MG tablet, Take 1 tablet (20 mg total) by mouth 2 (two) times daily., Disp: 60 tablet, Rfl: 0 .  Cetirizine HCl 10 MG CAPS, Take 1 capsule (10 mg total) by mouth daily., Disp: 30  capsule, Rfl: 6 .  diazepam (VALIUM) 10 MG tablet, Take 1 tablet (10 mg total) by mouth daily., Disp: 30 tablet, Rfl: 2 .  fluticasone (FLONASE) 50 MCG/ACT nasal spray, 2 sprays by Each Nare route daily., Disp: 16 g, Rfl: 3 .  Ibuprofen-Famotidine (DUEXIS) 800-26.6 MG TABS, Take 1 tablet by mouth 3 (three) times daily as needed. 1 tab po tid X 14 days then 1 tab po tid as needed, Disp: 90 tablet, Rfl: 2 .  Olopatadine HCl 0.2 % SOLN, ADMINISTER 1 DROP TO BOTH EYES TWO TIMES A DAY, Disp: , Rfl:  .  oxyCODONE-acetaminophen (PERCOCET/ROXICET) 5-325 MG tablet, Take 1 tablet by mouth every 4 (four) hours as needed for severe pain., Disp: 45 tablet, Rfl: 0 .  venlafaxine XR (EFFEXOR-XR) 37.5 MG 24 hr capsule, Take 1 capsule (37.5 mg total) by mouth daily with breakfast., Disp: 90 capsule, Rfl: 0  Allergies  Allergen Reactions  . Paclitaxel Anaphylaxis and Shortness Of Breath  . Tramadol Other (See Comments)    Nightmares and nausea and  vomiting    . Tape Rash    Objective:   VITALS: Per patient if applicable, see vitals. GENERAL: Alert, appears well and in no acute distress. HEENT: Atraumatic, conjunctiva clear, no obvious abnormalities on inspection of external nose and ears. NECK: Normal movements of the head and neck. CARDIOPULMONARY: No increased WOB. Speaking in clear sentences. I:E ratio WNL.  MS: Moves all visible extremities without noticeable abnormality. PSYCH: Pleasant and cooperative, well-groomed. Speech normal rate and rhythm. Affect is appropriate. Insight and judgement are appropriate.  Attention is focused, linear, and appropriate.  NEURO: CN grossly intact. Oriented as arrived to appointment on time with no prompting. Moves both UE equally.  SKIN: No obvious lesions, wounds, erythema, or cyanosis noted on face or hands.  Depression screen [facility] 2/9 01/13/2019 08/12/2018  Decreased Interest 0 0  Down, Depressed, Hopeless 1 0  PHQ - 2 Score 1 0  Altered sleeping 0 -  Tired,  decreased energy 1 -  Change in appetite 1 -  Feeling bad or failure about yourself  0 -  Trouble concentrating 1 -  Moving slowly or fidgety/restless 0 -  Suicidal thoughts 0 -  PHQ-9 Score 4 -  Difficult doing work/chores Not difficult at all -    Assessment and Plan:   There are no diagnoses linked to this encounter.  Marland Kitchen COVID-19 Education: The signs and symptoms of COVID-19 were discussed with the patient and how to seek care for testing if needed. The importance of social distancing was discussed today. . Reviewed expectations re: course of current medical issues. . Discussed self-management of symptoms. . Outlined signs and symptoms indicating need for more acute intervention. . Patient verbalized understanding and all questions were answered. Marland Kitchen Health Maintenance issues including appropriate healthy diet, exercise, and smoking avoidance were discussed with patient. . See orders for this visit as documented in the electronic medical record.  Briscoe Deutscher, DO  Records requested if needed. Time spent: *** minutes, of which >50% was spent in obtaining information about her symptoms, reviewing her previous labs, evaluations, and treatments, counseling her about her condition (please see the discussed topics above), and developing a plan to further investigate it; she had a number of questions which I addressed.

## 2019-03-24 ENCOUNTER — Ambulatory Visit: Payer: BLUE CROSS/BLUE SHIELD | Admitting: Family Medicine

## 2019-03-24 ENCOUNTER — Ambulatory Visit: Payer: BLUE CROSS/BLUE SHIELD | Admitting: Sports Medicine

## 2019-03-26 ENCOUNTER — Ambulatory Visit: Payer: BLUE CROSS/BLUE SHIELD | Admitting: Family Medicine

## 2019-03-27 ENCOUNTER — Other Ambulatory Visit: Payer: Self-pay | Admitting: Family Medicine

## 2019-03-27 DIAGNOSIS — S82892D Other fracture of left lower leg, subsequent encounter for closed fracture with routine healing: Secondary | ICD-10-CM

## 2019-03-27 NOTE — Progress Notes (Signed)
Virtual Visit via Video   Due to the COVID-19 pandemic, this visit was completed with telemedicine (audio/video) technology to reduce patient and provider exposure as well as to preserve personal protective equipment.   I connected with Natasha Mcclain by a video enabled telemedicine application and verified that I am speaking with the correct person using two identifiers. Location patient: Home Location provider: [facility], Office Persons participating in the virtual visit: Judeen, Geralds, DO Lonell Grandchild, CMA acting as scribe for Dr. Briscoe Deutscher.   I discussed the limitations of evaluation and management by telemedicine and the availability of in person appointments. The patient expressed understanding and agreed to proceed.  Care Team   Patient Care Team: Briscoe Deutscher, DO as PCP - General (Family Medicine)  Subjective:   HPI: She was able to follow up with Orthopedics last week - grade 3 sprain along with fracture. She has scooter now. She will need refill on her pain medications as she is taking about 6 a day; one every 4 hrs.  She will start PT next week. Pain is worst at night.   Review of Systems  Constitutional: Negative for chills and fever.  HENT: Negative for hearing loss and tinnitus.   Eyes: Negative for blurred vision and double vision.  Respiratory: Negative for cough and hemoptysis.   Cardiovascular: Negative for chest pain and palpitations.  Gastrointestinal: Negative for heartburn and nausea.  Genitourinary: Negative for dysuria and urgency.  Musculoskeletal: Negative for myalgias.  Skin: Negative for rash.  Neurological: Negative for dizziness and headaches.  Endo/Heme/Allergies: Negative for environmental allergies. Does not bruise/bleed easily.  Psychiatric/Behavioral: Negative for depression and suicidal ideas.    Patient Active Problem List   Diagnosis Date Noted  . Attention deficit hyperactivity disorder (ADHD), combined type  01/13/2019  . Seasonal allergies, on Zyrtec and Flonase 01/13/2019  . Muscle spasms of neck 01/13/2019  . Closed displaced fracture of proximal phalanx of left thumb 01/11/2016  . Gamekeeper's thumb, left 01/07/2016  . Breast cancer ([place]) 08/13/2013  . 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 Absence of menstruation 06/20/2011  . Ovarian cyst 01/06/2011  . Mixed emotional features as adjustment reaction 12/16/2010    Social History   Tobacco Use  . Smoking status: Never Smoker  . Smokeless tobacco: Never Used  Substance Use Topics  . Alcohol use: Yes    Comment: Social    Current Outpatient Medications:  .  ammonium lactate (LAC-HYDRIN) 12 % cream, Apply topically as needed for dry skin., Disp: 385 g, Rfl: 0 .  amphetamine-dextroamphetamine (ADDERALL) 20 MG tablet, Take 1 tablet (20 mg total) by mouth 2 (two) times daily., Disp: 60 tablet, Rfl: 0 .  Cetirizine HCl 10 MG CAPS, Take 1 capsule (10 mg total) by mouth daily., Disp: 30 capsule, Rfl: 6 .  diazepam (VALIUM) 10 MG tablet, Take 1 tablet (10 mg total) by mouth daily., Disp: 30 tablet, Rfl: 2 .  fluticasone (FLONASE) 50 MCG/ACT nasal spray, 2 sprays by Each Nare route daily., Disp: 16 g, Rfl: 3 .  Ibuprofen-Famotidine (DUEXIS) 800-26.6 MG TABS, Take 1 tablet by mouth 3 (three) times daily as needed. 1 tab po tid X 14 days then 1 tab po tid as needed, Disp: 90 tablet, Rfl: 2 .  Olopatadine HCl 0.2 % SOLN, ADMINISTER 1 DROP TO BOTH EYES TWO TIMES A DAY, Disp: , Rfl:  .  oxyCODONE-acetaminophen (PERCOCET/ROXICET) 5-325 MG tablet, Take 1 tablet by mouth every  4 (four) hours as needed for severe pain., Disp: 45 tablet, Rfl: 0 .  venlafaxine XR (EFFEXOR-XR) 37.5 MG 24 hr capsule, Take 1 capsule (37.5 mg total) by mouth daily with breakfast., Disp: 90 capsule, Rfl: 0  Allergies  Allergen Reactions  . Paclitaxel Anaphylaxis and Shortness Of Breath  . Tramadol Other (See Comments)    Nightmares and nausea and   vomiting    . Tape Rash   Objective:   VITALS: Per patient if applicable, see vitals. GENERAL: Alert, appears well and in no acute distress. HEENT: Atraumatic, conjunctiva clear, no obvious abnormalities on inspection of external nose and ears. NECK: Normal movements of the head and neck. CARDIOPULMONARY: No increased WOB. Speaking in clear sentences. I:E ratio WNL.  PSYCH: Pleasant and cooperative, well-groomed. Speech normal rate and rhythm. Affect is appropriate. Insight and judgement are appropriate. Attention is focused, linear, and appropriate.  NEURO: CN grossly intact. Oriented as arrived to appointment on time with no prompting. Moves both UE equally.  SKIN: No obvious lesions, wounds, erythema, or cyanosis noted on face or hands.  Depression screen [facility] 2/9 01/13/2019 08/12/2018  Decreased Interest 0 0  Down, Depressed, Hopeless 1 0  PHQ - 2 Score 1 0  Altered sleeping 0 -  Tired, decreased energy 1 -  Change in appetite 1 -  Feeling bad or failure about yourself  0 -  Trouble concentrating 1 -  Moving slowly or fidgety/restless 0 -  Suicidal thoughts 0 -  PHQ-9 Score 4 -  Difficult doing work/chores Not difficult at all -   Assessment and Plan:   Natasha Mcclain was seen today for follow-up.  Diagnoses and all orders for this visit:  Closed avulsion fracture of left ankle with routine healing, subsequent encounter -     gabapentin (NEURONTIN) 100 MG capsule; 1-3 tab QHS -     oxyCODONE-acetaminophen (PERCOCET) 10-325 MG tablet; Take 1 tablet by mouth every 4 (four) hours as needed for up to 7 days for pain. -     venlafaxine XR (EFFEXOR XR) 75 MG 24 hr capsule; Take 1 capsule (75 mg total) by mouth daily with breakfast.  Acute left ankle pain -     gabapentin (NEURONTIN) 100 MG capsule; 1-3 tab QHS -     oxyCODONE-acetaminophen (PERCOCET) 10-325 MG tablet; Take 1 tablet by mouth every 4 (four) hours as needed for up to 7 days for pain. -     venlafaxine XR (EFFEXOR XR) 75 MG 24  hr capsule; Take 1 capsule (75 mg total) by mouth daily with breakfast.  Perimenopausal vasomotor symptoms, controlled with Effexor -     venlafaxine XR (EFFEXOR XR) 75 MG 24 hr capsule; Take 1 capsule (75 mg total) by mouth daily with breakfast.   . COVID-19 Education: The signs and symptoms of COVID-19 were discussed with the patient and how to seek care for testing if needed. The importance of social distancing was discussed today. . Reviewed expectations re: course of current medical issues. . Discussed self-management of symptoms. . Outlined signs and symptoms indicating need for more acute intervention. . Patient verbalized understanding and all questions were answered. Marland Kitchen Health Maintenance issues including appropriate healthy diet, exercise, and smoking avoidance were discussed with patient. . See orders for this visit as documented in the electronic medical record.  Briscoe Deutscher, DO  Records requested if needed. Time spent: 25 minutes, of which >50% was spent in obtaining information about her symptoms, reviewing her previous labs, evaluations, and treatments, counseling  her about her condition (please see the discussed topics above), and developing a plan to further investigate it; she had a number of questions which I addressed.

## 2019-03-28 ENCOUNTER — Ambulatory Visit (INDEPENDENT_AMBULATORY_CARE_PROVIDER_SITE_OTHER): Payer: BLUE CROSS/BLUE SHIELD | Admitting: Family Medicine

## 2019-03-28 ENCOUNTER — Encounter: Payer: Self-pay | Admitting: Family Medicine

## 2019-03-28 ENCOUNTER — Other Ambulatory Visit: Payer: Self-pay

## 2019-03-28 VITALS — Ht 69.0 in | Wt 167.0 lb

## 2019-03-28 DIAGNOSIS — S82892D Other fracture of left lower leg, subsequent encounter for closed fracture with routine healing: Secondary | ICD-10-CM | POA: Diagnosis not present

## 2019-03-28 DIAGNOSIS — M25572 Pain in left ankle and joints of left foot: Secondary | ICD-10-CM

## 2019-03-28 DIAGNOSIS — N951 Menopausal and female climacteric states: Secondary | ICD-10-CM

## 2019-03-28 MED ORDER — GABAPENTIN 100 MG PO CAPS: ORAL_CAPSULE | ORAL | 3 refills | Status: DC

## 2019-03-28 MED ORDER — OXYCODONE-ACETAMINOPHEN 10-325 MG PO TABS: 1.0000 | ORAL_TABLET | ORAL | 0 refills | Status: AC | PRN

## 2019-03-28 NOTE — Patient Instructions (Signed)
We are starting you on gabapentin 100mg  at night. You can start taking one a night and work up to three at night. This will help with not only your pain but your sleep as well.

## 2019-03-29 ENCOUNTER — Encounter: Payer: Self-pay | Admitting: Family Medicine

## 2019-03-29 MED ORDER — VENLAFAXINE HCL ER 75 MG PO CP24: 75.0000 mg | ORAL_CAPSULE | Freq: Every day | ORAL | 0 refills | Status: DC

## 2019-04-01 DIAGNOSIS — M25572 Pain in left ankle and joints of left foot: Secondary | ICD-10-CM | POA: Diagnosis not present

## 2019-04-03 DIAGNOSIS — M25572 Pain in left ankle and joints of left foot: Secondary | ICD-10-CM | POA: Diagnosis not present

## 2019-04-08 DIAGNOSIS — M25572 Pain in left ankle and joints of left foot: Secondary | ICD-10-CM | POA: Diagnosis not present

## 2019-04-09 ENCOUNTER — Encounter: Payer: Self-pay | Admitting: Family Medicine

## 2019-04-09 NOTE — Telephone Encounter (Signed)
Last OV 03/28/19 Not on current med list

## 2019-04-10 DIAGNOSIS — M25572 Pain in left ankle and joints of left foot: Secondary | ICD-10-CM | POA: Diagnosis not present

## 2019-04-10 MED ORDER — OXYCODONE-ACETAMINOPHEN 10-325 MG PO TABS: 1.0000 | ORAL_TABLET | Freq: Four times a day (QID) | ORAL | 0 refills | Status: AC | PRN

## 2019-04-15 DIAGNOSIS — M25572 Pain in left ankle and joints of left foot: Secondary | ICD-10-CM | POA: Diagnosis not present

## 2019-04-17 DIAGNOSIS — M25572 Pain in left ankle and joints of left foot: Secondary | ICD-10-CM | POA: Diagnosis not present

## 2019-04-19 ENCOUNTER — Other Ambulatory Visit: Payer: Self-pay | Admitting: Family Medicine

## 2019-04-19 DIAGNOSIS — M25572 Pain in left ankle and joints of left foot: Secondary | ICD-10-CM

## 2019-04-19 DIAGNOSIS — S82892D Other fracture of left lower leg, subsequent encounter for closed fracture with routine healing: Secondary | ICD-10-CM

## 2019-04-21 DIAGNOSIS — S93402A Sprain of unspecified ligament of left ankle, initial encounter: Secondary | ICD-10-CM | POA: Insufficient documentation

## 2019-04-21 DIAGNOSIS — M25572 Pain in left ankle and joints of left foot: Secondary | ICD-10-CM | POA: Diagnosis not present

## 2019-04-22 DIAGNOSIS — M25572 Pain in left ankle and joints of left foot: Secondary | ICD-10-CM | POA: Diagnosis not present

## 2019-04-23 ENCOUNTER — Other Ambulatory Visit: Payer: Self-pay | Admitting: Family Medicine

## 2019-04-23 DIAGNOSIS — F902 Attention-deficit hyperactivity disorder, combined type: Secondary | ICD-10-CM

## 2019-04-24 ENCOUNTER — Encounter: Payer: Self-pay | Admitting: Family Medicine

## 2019-04-24 DIAGNOSIS — M25572 Pain in left ankle and joints of left foot: Secondary | ICD-10-CM | POA: Diagnosis not present

## 2019-04-24 DIAGNOSIS — F902 Attention-deficit hyperactivity disorder, combined type: Secondary | ICD-10-CM

## 2019-04-24 MED ORDER — AMPHETAMINE-DEXTROAMPHETAMINE 20 MG PO TABS: 20.0000 mg | ORAL_TABLET | Freq: Two times a day (BID) | ORAL | 0 refills | Status: DC

## 2019-04-24 NOTE — Telephone Encounter (Signed)
Rx request 

## 2019-04-28 DIAGNOSIS — M25572 Pain in left ankle and joints of left foot: Secondary | ICD-10-CM | POA: Diagnosis not present

## 2019-04-29 ENCOUNTER — Other Ambulatory Visit: Payer: Self-pay

## 2019-04-29 DIAGNOSIS — N649 Disorder of breast, unspecified: Secondary | ICD-10-CM | POA: Diagnosis not present

## 2019-04-29 DIAGNOSIS — C50912 Malignant neoplasm of unspecified site of left female breast: Secondary | ICD-10-CM | POA: Diagnosis not present

## 2019-04-29 DIAGNOSIS — Z9012 Acquired absence of left breast and nipple: Secondary | ICD-10-CM | POA: Diagnosis not present

## 2019-04-29 DIAGNOSIS — Z853 Personal history of malignant neoplasm of breast: Secondary | ICD-10-CM | POA: Diagnosis not present

## 2019-04-29 DIAGNOSIS — Z170 Estrogen receptor positive status [ER+]: Secondary | ICD-10-CM | POA: Diagnosis not present

## 2019-04-29 DIAGNOSIS — Z08 Encounter for follow-up examination after completed treatment for malignant neoplasm: Secondary | ICD-10-CM | POA: Diagnosis not present

## 2019-04-30 MED ORDER — OXYCODONE-ACETAMINOPHEN 10-325 MG PO TABS: 1.0000 | ORAL_TABLET | Freq: Two times a day (BID) | ORAL | 0 refills | Status: AC

## 2019-05-01 DIAGNOSIS — M25572 Pain in left ankle and joints of left foot: Secondary | ICD-10-CM | POA: Diagnosis not present

## 2019-05-02 ENCOUNTER — Other Ambulatory Visit: Payer: Self-pay | Admitting: Family Medicine

## 2019-05-02 DIAGNOSIS — J302 Other seasonal allergic rhinitis: Secondary | ICD-10-CM

## 2019-05-02 NOTE — Telephone Encounter (Signed)
Rx request Last fill 01/13/19 Last ov 03/28/19

## 2019-05-06 DIAGNOSIS — M25572 Pain in left ankle and joints of left foot: Secondary | ICD-10-CM | POA: Diagnosis not present

## 2019-05-08 DIAGNOSIS — M25572 Pain in left ankle and joints of left foot: Secondary | ICD-10-CM | POA: Diagnosis not present

## 2019-05-12 DIAGNOSIS — M25572 Pain in left ankle and joints of left foot: Secondary | ICD-10-CM | POA: Diagnosis not present

## 2019-05-14 DIAGNOSIS — M25572 Pain in left ankle and joints of left foot: Secondary | ICD-10-CM | POA: Diagnosis not present

## 2019-05-20 DIAGNOSIS — M25572 Pain in left ankle and joints of left foot: Secondary | ICD-10-CM | POA: Diagnosis not present

## 2019-05-22 DIAGNOSIS — M25572 Pain in left ankle and joints of left foot: Secondary | ICD-10-CM | POA: Diagnosis not present

## 2019-05-27 DIAGNOSIS — M25572 Pain in left ankle and joints of left foot: Secondary | ICD-10-CM | POA: Diagnosis not present

## 2019-05-29 DIAGNOSIS — M25572 Pain in left ankle and joints of left foot: Secondary | ICD-10-CM | POA: Diagnosis not present

## 2019-06-02 DIAGNOSIS — M25572 Pain in left ankle and joints of left foot: Secondary | ICD-10-CM | POA: Diagnosis not present

## 2019-06-02 DIAGNOSIS — S93402D Sprain of unspecified ligament of left ankle, subsequent encounter: Secondary | ICD-10-CM | POA: Diagnosis not present

## 2019-06-11 DIAGNOSIS — M25572 Pain in left ankle and joints of left foot: Secondary | ICD-10-CM | POA: Diagnosis not present

## 2019-06-18 DIAGNOSIS — S93402D Sprain of unspecified ligament of left ankle, subsequent encounter: Secondary | ICD-10-CM | POA: Diagnosis not present

## 2019-06-18 DIAGNOSIS — M25572 Pain in left ankle and joints of left foot: Secondary | ICD-10-CM | POA: Diagnosis not present

## 2019-06-27 ENCOUNTER — Other Ambulatory Visit: Payer: Self-pay | Admitting: Family Medicine

## 2019-06-27 DIAGNOSIS — M25572 Pain in left ankle and joints of left foot: Secondary | ICD-10-CM

## 2019-06-27 DIAGNOSIS — S82892D Other fracture of left lower leg, subsequent encounter for closed fracture with routine healing: Secondary | ICD-10-CM

## 2019-06-27 DIAGNOSIS — F902 Attention-deficit hyperactivity disorder, combined type: Secondary | ICD-10-CM

## 2019-06-30 ENCOUNTER — Other Ambulatory Visit: Payer: Self-pay | Admitting: Family Medicine

## 2019-06-30 DIAGNOSIS — N951 Menopausal and female climacteric states: Secondary | ICD-10-CM

## 2019-06-30 DIAGNOSIS — M25572 Pain in left ankle and joints of left foot: Secondary | ICD-10-CM

## 2019-06-30 DIAGNOSIS — S82892D Other fracture of left lower leg, subsequent encounter for closed fracture with routine healing: Secondary | ICD-10-CM

## 2019-06-30 MED ORDER — GABAPENTIN 100 MG PO CAPS: ORAL_CAPSULE | ORAL | 0 refills | Status: DC

## 2019-06-30 NOTE — Telephone Encounter (Signed)
Last fill 04/24/19  #60/0 Last OV 03/28/19 Okay to refill?

## 2019-06-30 NOTE — Telephone Encounter (Signed)
Last fill 04/21/19  #270/0 Last OV 03/28/19

## 2019-06-30 NOTE — Telephone Encounter (Signed)
Last fill 03/29/19 #90/0 Last OV 03/28/19 Ok to refill?

## 2019-07-01 ENCOUNTER — Other Ambulatory Visit: Payer: Self-pay | Admitting: Family Medicine

## 2019-07-01 ENCOUNTER — Encounter: Payer: Self-pay | Admitting: Family Medicine

## 2019-07-01 DIAGNOSIS — F902 Attention-deficit hyperactivity disorder, combined type: Secondary | ICD-10-CM

## 2019-07-01 MED ORDER — AMPHETAMINE-DEXTROAMPHETAMINE 20 MG PO TABS: 20.0000 mg | ORAL_TABLET | Freq: Two times a day (BID) | ORAL | 0 refills | Status: DC

## 2019-07-09 ENCOUNTER — Encounter: Payer: Self-pay | Admitting: Family Medicine

## 2019-07-09 ENCOUNTER — Ambulatory Visit (INDEPENDENT_AMBULATORY_CARE_PROVIDER_SITE_OTHER): Payer: BC Managed Care – PPO | Admitting: Family Medicine

## 2019-07-09 VITALS — Ht 69.0 in | Wt 165.0 lb

## 2019-07-09 DIAGNOSIS — M25572 Pain in left ankle and joints of left foot: Secondary | ICD-10-CM | POA: Diagnosis not present

## 2019-07-09 NOTE — Progress Notes (Signed)
Virtual Visit via Video   Due to the COVID-19 pandemic, this visit was completed with telemedicine (audio/video) technology to reduce patient and provider exposure as well as to preserve personal protective equipment.   I connected with Natasha Mcclain by a video enabled telemedicine application and verified that I am speaking with the correct person using two identifiers. Location patient: Home Location provider: [facility], Office Persons participating in the virtual visit: Natasha Mcclain, [place], DO Lonell Grandchild, CMA acting as scribe for Dr. Briscoe Mcclain.   I discussed the limitations of evaluation and management by telemedicine and the availability of in person appointments. The patient expressed understanding and agreed to proceed.  Care Team   Patient Care Team: Natasha Deutscher, DO as PCP - General (Family Medicine)  Subjective:   HPI:   Patient had significant foot pain and ankle pain after her injury.  She ended up going to orthopedic surgeon and after 2 rounds of physical therapy had an MRI which showed multiple tears of ligaments and tendons.  I do not have access to the MRI report.  She is now in a lace up boot when moving around.  She continues to ice her ankle and foot.  She has not been taking any opioids for many weeks.  Her prognosis is complete recovery in the next 6 months or so.  She is optimistic.  Review of Systems  Constitutional: Negative for chills and fever.  HENT: Negative for hearing loss and tinnitus.   Eyes: Negative for double vision.  Respiratory: Negative for cough and wheezing.   Cardiovascular: Negative for chest pain, palpitations and leg swelling.  Gastrointestinal: Negative for nausea and vomiting.  Genitourinary: Negative for dysuria and urgency.  Neurological: Negative for dizziness and headaches.  Psychiatric/Behavioral: Negative for depression and suicidal ideas.    Patient Active Problem List   Diagnosis Date Noted  . Sprain of  left ankle 04/21/2019  . Pain of joint of left ankle and foot 03/28/2019  . Attention deficit hyperactivity disorder (ADHD), combined type 01/13/2019  . Seasonal allergies, on Zyrtec and Flonase 01/13/2019  . Muscle spasms of neck 01/13/2019  . Closed displaced fracture of proximal phalanx of left thumb 01/11/2016  . Gamekeeper's thumb, left 01/07/2016  . Breast cancer ([place]) 08/13/2013  . 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 Absence of menstruation 06/20/2011  . Ovarian cyst 01/06/2011  . Mixed emotional features as adjustment reaction 12/16/2010    Social History   Tobacco Use  . Smoking status: Never Smoker  . Smokeless tobacco: Never Used  Substance Use Topics  . Alcohol use: Yes    Comment: Social     Current Outpatient Medications:  .  amphetamine-dextroamphetamine (ADDERALL) 20 MG tablet, Take 1 tablet (20 mg total) by mouth 2 (two) times daily., Disp: 60 tablet, Rfl: 0 .  Cetirizine HCl 10 MG CAPS, Take 1 capsule (10 mg total) by mouth daily., Disp: 30 capsule, Rfl: 6 .  diazepam (VALIUM) 10 MG tablet, Take 1 tablet (10 mg total) by mouth daily., Disp: 30 tablet, Rfl: 2 .  fluticasone (FLONASE) 50 MCG/ACT nasal spray, SPRAY 2 SPRAYS INTO EACH NOSTRIL EVERY DAY, Disp: 16 mL, Rfl: 3 .  gabapentin (NEURONTIN) 100 MG capsule, TAKE 1-3 CAPSULES BY MOUTH AT BEDTIME, Disp: 270 capsule, Rfl: 0 .  Olopatadine HCl 0.2 % SOLN, ADMINISTER 1 DROP TO BOTH EYES TWO TIMES A DAY, Disp: , Rfl:  .  venlafaxine (EFFEXOR) 37.5 MG tablet, Take  37.5 mg by mouth 2 (two) times daily., Disp: , Rfl:   Allergies  Allergen Reactions  . Paclitaxel Anaphylaxis and Shortness Of Breath  . Short Ragweed Pollen Ext     Other reaction(s): Eye Redness  . Tramadol Other (See Comments)    Nightmares and nausea and  vomiting    . Tape Rash    Objective:   VITALS: Per patient if applicable, see vitals. GENERAL: Alert, appears well and in no acute distress. HEENT: Atraumatic,  conjunctiva clear, no obvious abnormalities on inspection of external nose and ears. NECK: Normal movements of the head and neck. CARDIOPULMONARY: No increased WOB. Speaking in clear sentences. I:E ratio WNL.  MS: Moves all visible extremities without noticeable abnormality. PSYCH: Pleasant and cooperative, well-groomed. Speech normal rate and rhythm. Affect is appropriate. Insight and judgement are appropriate. Attention is focused, linear, and appropriate.  NEURO: CN grossly intact. Oriented as arrived to appointment on time with no prompting. Moves both UE equally.  SKIN: No obvious lesions, wounds, erythema, or cyanosis noted on face or hands.  Depression screen [facility] 2/9 01/13/2019 08/12/2018  Decreased Interest 0 0  Down, Depressed, Hopeless 1 0  PHQ - 2 Score 1 0  Altered sleeping 0 -  Tired, decreased energy 1 -  Change in appetite 1 -  Feeling bad or failure about yourself  0 -  Trouble concentrating 1 -  Moving slowly or fidgety/restless 0 -  Suicidal thoughts 0 -  PHQ-9 Score 4 -  Difficult doing work/chores Not difficult at all -    Assessment and Plan:   Keelin was seen today for follow-up.  Diagnoses and all orders for this visit:  Pain of joint of left ankle and foot Comments: Sounds like severe complicated sprain.  She continues home physical therapy and icing.  We will continue to work on getting back to yoga.   Marland Kitchen COVID-19 Education: The signs and symptoms of COVID-19 were discussed with the patient and how to seek care for testing if needed. The importance of social distancing was discussed today. . Reviewed expectations re: course of current medical issues. . Discussed self-management of symptoms. . Outlined signs and symptoms indicating need for more acute intervention. . Patient verbalized understanding and all questions were answered. Marland Kitchen Health Maintenance issues including appropriate healthy diet, exercise, and smoking avoidance were discussed with  patient. . See orders for this visit as documented in the electronic medical record.  Natasha Deutscher, DO  Records requested if needed. Time spent: 30 minutes, of which >50% was spent in obtaining information about her symptoms, reviewing her previous labs, evaluations, and treatments, counseling her about her condition (please see the discussed topics above), and developing a plan to further investigate it; she had a number of questions which I addressed.

## 2019-07-14 ENCOUNTER — Encounter: Payer: Self-pay | Admitting: Family Medicine

## 2019-07-17 ENCOUNTER — Encounter: Payer: Self-pay | Admitting: Family Medicine

## 2019-07-22 NOTE — Telephone Encounter (Signed)
Can you call patient and let know process for medical records.

## 2019-07-28 ENCOUNTER — Other Ambulatory Visit: Payer: Self-pay | Admitting: Family Medicine

## 2019-07-28 DIAGNOSIS — M62838 Other muscle spasm: Secondary | ICD-10-CM

## 2019-07-29 ENCOUNTER — Other Ambulatory Visit: Payer: Self-pay | Admitting: Family Medicine

## 2019-07-29 ENCOUNTER — Encounter: Payer: Self-pay | Admitting: Family Medicine

## 2019-07-29 DIAGNOSIS — M62838 Other muscle spasm: Secondary | ICD-10-CM

## 2019-07-29 DIAGNOSIS — F902 Attention-deficit hyperactivity disorder, combined type: Secondary | ICD-10-CM

## 2019-07-29 NOTE — Telephone Encounter (Signed)
Hi, I need my diazepam and my dextroamphetamine prescriptions refilled by this Friday September 18th. Unfortunately, I am still, without a vehicle; and still barely, walking around my home on my injured ankle so, I can't come to your office.  Please, help.  Thanks, Natasha Mcclain

## 2019-07-29 NOTE — Telephone Encounter (Signed)
Added to another open note

## 2019-07-29 NOTE — Telephone Encounter (Signed)
Last OV:  07/09/2019 Next OV:  Not Yet Scheduled:   Last Fill:  06/27/2019  Please advise.

## 2019-07-29 NOTE — Telephone Encounter (Signed)
Last OV:  07/09/2019 Next OV:  Not yet scheduled. Last Fill:  07/01/2019  Please advise.

## 2019-07-30 MED ORDER — DIAZEPAM 10 MG PO TABS: 10.0000 mg | ORAL_TABLET | Freq: Every day | ORAL | 2 refills | Status: DC

## 2019-07-30 MED ORDER — AMPHETAMINE-DEXTROAMPHETAMINE 20 MG PO TABS: 20.0000 mg | ORAL_TABLET | Freq: Two times a day (BID) | ORAL | 0 refills | Status: DC

## 2019-08-03 NOTE — Telephone Encounter (Signed)
Duplicate I think

## 2019-08-28 NOTE — Progress Notes (Signed)
Virtual Visit via Video   Due to the COVID-19 pandemic, this visit was completed with telemedicine (audio/video) technology to reduce patient and provider exposure as well as to preserve personal protective equipment.   I connected with Natasha Mcclain by a video enabled telemedicine application and verified that I am speaking with the correct person using two identifiers. Location patient: Home Location provider:  [facility], Office Persons participating in the virtual visit: [place], [place], DO Lonell Grandchild, CMA acting as scribe for Dr. Briscoe Deutscher.   I discussed the limitations of evaluation and management by telemedicine and the availability of in person appointments. The patient expressed understanding and agreed to proceed.  Care Team   Patient Care Team: Briscoe Deutscher, DO as PCP - General (Family Medicine)  Subjective:   HPI: Patient has increased her Effexor to 75mg  for last two weeks due to passing of friend. She also is requesting refill on adderall and valium today. Missed her breast cancer anniversary for the first time this year, makes her happy. Interested in another opinion re: ankle/foot pain since not healing the way that she had hoped. Has charity care through [facility], so would like to go there for future visits.   Concerned about cardiac health s/p chemo and Tamoxifen.   Lab Results  Component Value Date   CHOL 219 (H) 08/12/2018   HDL 70.60 08/12/2018   LDLCALC 127 (H) 08/12/2018   TRIG 105.0 08/12/2018   CHOLHDL 3 08/12/2018   The 10-year ASCVD risk score Mikey Bussing DC Jr., et al., 2013) is: 1.3%   Values used to calculate the score:     Age: 53 years     Sex: Female     Is Non-Hispanic African American: No     Diabetic: No     Tobacco smoker: No     Systolic Blood Pressure: [id number] mmHg     Is BP treated: No     HDL Cholesterol: 70.6 mg/dL     Total Cholesterol: 219 mg/dL  Review of Systems  All other systems reviewed and are negative.     Patient Active Problem List   Diagnosis Date Noted  . Closed avulsion fracture of ankle with routine healing 08/31/2019  . Sprain of left ankle 04/21/2019  . Pain of joint of left ankle and foot 03/28/2019  . Attention deficit hyperactivity disorder (ADHD), combined type 01/13/2019  . Seasonal allergies, on Zyrtec and Flonase 01/13/2019  . Muscle spasms of neck 01/13/2019  . Closed displaced fracture of proximal phalanx of left thumb 01/11/2016  . Gamekeeper's thumb, left 01/07/2016  . Breast cancer ([place]) 08/13/2013  . 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 Ovarian cyst 01/06/2011  . Mixed emotional features as adjustment reaction 12/16/2010    Social History   Tobacco Use  . Smoking status: Never Smoker  . Smokeless tobacco: Never Used  Substance Use Topics  . Alcohol use: Yes    Comment: Social     Current Outpatient Medications:  .  [START ON 09/28/2019] amphetamine-dextroamphetamine (ADDERALL) 20 MG tablet, Take 1 tablet (20 mg total) by mouth 2 (two) times daily., Disp: 60 tablet, Rfl: 0 .  Cetirizine HCl 10 MG CAPS, Take 1 capsule (10 mg total) by mouth daily., Disp: 30 capsule, Rfl: 6 .  diazepam (VALIUM) 10 MG tablet, Take 1 tablet (10 mg total) by mouth daily., Disp: 30 tablet, Rfl: 2 .  fluticasone (FLONASE) 50 MCG/ACT nasal spray, SPRAY 2 SPRAYS INTO EACH NOSTRIL  EVERY DAY, Disp: 16 mL, Rfl: 3 .  gabapentin (NEURONTIN) 100 MG capsule, TAKE 1-3 CAPSULES BY MOUTH AT BEDTIME, Disp: 270 capsule, Rfl: 0 .  Olopatadine HCl 0.2 % SOLN, ADMINISTER 1 DROP TO BOTH EYES TWO TIMES A DAY, Disp: , Rfl:  .  venlafaxine (EFFEXOR) 37.5 MG tablet, Take 1 tablet (37.5 mg total) by mouth 2 (two) times daily., Disp: 60 tablet, Rfl: 3 .  amphetamine-dextroamphetamine (ADDERALL) 20 MG tablet, Take 1 tablet (20 mg total) by mouth 2 (two) times daily., Disp: 60 tablet, Rfl: 0 .  amphetamine-dextroamphetamine (ADDERALL) 20 MG tablet, Take 1 tablet (20 mg total) by mouth  2 (two) times daily., Disp: 60 tablet, Rfl: 0 .  diazepam (VALIUM) 10 MG tablet, Take 1 tablet (10 mg total) by mouth every 12 (twelve) hours as needed for anxiety., Disp: 30 tablet, Rfl: 1  Allergies  Allergen Reactions  . Paclitaxel Anaphylaxis and Shortness Of Breath  . Short Ragweed Pollen Ext     Other reaction(s): Eye Redness  . Tramadol Other (See Comments)    Nightmares and nausea and  vomiting    . Tape Rash    Objective:   VITALS: Per patient if applicable, see vitals. GENERAL: Alert, appears well and in no acute distress. HEENT: Atraumatic, conjunctiva clear, no obvious abnormalities on inspection of external nose and ears. NECK: Normal movements of the head and neck. CARDIOPULMONARY: No increased WOB. Speaking in clear sentences. I:E ratio WNL.  MS: Moves all visible extremities without noticeable abnormality. PSYCH: Pleasant and cooperative, well-groomed. Speech normal rate and rhythm. Affect is appropriate. Insight and judgement are appropriate. Attention is focused, linear, and appropriate.  NEURO: CN grossly intact. Oriented as arrived to appointment on time with no prompting. Moves both UE equally.  SKIN: No obvious lesions, wounds, erythema, or cyanosis noted on face or hands.  Depression screen [facility] 2/9 01/13/2019 08/12/2018  Decreased Interest 0 0  Down, Depressed, Hopeless 1 0  PHQ - 2 Score 1 0  Altered sleeping 0 -  Tired, decreased energy 1 -  Change in appetite 1 -  Feeling bad or failure about yourself  0 -  Trouble concentrating 1 -  Moving slowly or fidgety/restless 0 -  Suicidal thoughts 0 -  PHQ-9 Score 4 -  Difficult doing work/chores Not difficult at all -    Assessment and Plan:   Arsema was seen today for follow-up.  Diagnoses and all orders for this visit:  Muscle spasms of neck -     diazepam (VALIUM) 10 MG tablet; Take 1 tablet (10 mg total) by mouth every 12 (twelve) hours as needed for anxiety. -     Discontinue: diazepam (VALIUM) 10  MG tablet; Take 1 tablet (10 mg total) by mouth daily. -     diazepam (VALIUM) 10 MG tablet; Take 1 tablet (10 mg total) by mouth daily.  Attention deficit hyperactivity disorder (ADHD), combined type, 4 month Rx given during PCP transition -     amphetamine-dextroamphetamine (ADDERALL) 20 MG tablet; Take 1 tablet (20 mg total) by mouth 2 (two) times daily. -     amphetamine-dextroamphetamine (ADDERALL) 20 MG tablet; Take 1 tablet (20 mg total) by mouth 2 (two) times daily. -     Discontinue: amphetamine-dextroamphetamine (ADDERALL) 20 MG tablet; Take 1 tablet (20 mg total) by mouth 2 (two) times daily. -     amphetamine-dextroamphetamine (ADDERALL) 20 MG tablet; Take 1 tablet (20 mg total) by mouth 2 (two) times daily.  Pain of  joint of left ankle and foot, wants second opinion at [facility] -     Ambulatory referral to Orthopedic Surgery  Malignant neoplasm of female breast, unspecified estrogen receptor status, unspecified laterality, unspecified site of breast ([place]), Hx of -     Ambulatory referral to Cardiology for Hx of chemo, req per patient  Seasonal allergies, on Zyrtec and Flonase -     fluticasone (FLONASE) 50 MCG/ACT nasal spray; SPRAY 2 SPRAYS INTO EACH NOSTRIL EVERY DAY  Closed avulsion fracture of left ankle with routine healing, subsequent encounter -     gabapentin (NEURONTIN) 100 MG capsule; TAKE 1-3 CAPSULES BY MOUTH AT BEDTIME -     Ambulatory referral to Orthopedic Surgery  History of cancer chemotherapy  Pure hypercholesterolemia -     Ambulatory referral to Cardiology  Mixed emotional features as adjustment reaction -     venlafaxine (EFFEXOR) 37.5 MG tablet; Take 1 tablet (37.5 mg total) by mouth 2 (two) times daily.  Marland Kitchen COVID-19 Education: The signs and symptoms of COVID-19 were discussed with the patient and how to seek care for testing if needed. The importance of social distancing was discussed today. . Reviewed expectations re: course of current medical  issues. . Discussed self-management of symptoms. . Outlined signs and symptoms indicating need for more acute intervention. . Patient verbalized understanding and all questions were answered. Marland Kitchen Health Maintenance issues including appropriate healthy diet, exercise, and smoking avoidance were discussed with patient. . See orders for this visit as documented in the electronic medical record.  Briscoe Deutscher, DO  Records requested if needed. Time spent: 25 minutes, of which >50% was spent in obtaining information about her symptoms, reviewing her previous labs, evaluations, and treatments, counseling her about her condition (please see the discussed topics above), and developing a plan to further investigate it; she had a number of questions which I addressed.

## 2019-08-29 ENCOUNTER — Other Ambulatory Visit: Payer: Self-pay | Admitting: Family Medicine

## 2019-08-29 ENCOUNTER — Ambulatory Visit (INDEPENDENT_AMBULATORY_CARE_PROVIDER_SITE_OTHER): Payer: BC Managed Care – PPO | Admitting: Family Medicine

## 2019-08-29 ENCOUNTER — Encounter: Payer: Self-pay | Admitting: Family Medicine

## 2019-08-29 VITALS — Ht 69.0 in | Wt 165.0 lb

## 2019-08-29 DIAGNOSIS — F4329 Adjustment disorder with other symptoms: Secondary | ICD-10-CM

## 2019-08-29 DIAGNOSIS — M62838 Other muscle spasm: Secondary | ICD-10-CM | POA: Diagnosis not present

## 2019-08-29 DIAGNOSIS — E7800 Pure hypercholesterolemia, unspecified: Secondary | ICD-10-CM

## 2019-08-29 DIAGNOSIS — M25572 Pain in left ankle and joints of left foot: Secondary | ICD-10-CM

## 2019-08-29 DIAGNOSIS — S82892D Other fracture of left lower leg, subsequent encounter for closed fracture with routine healing: Secondary | ICD-10-CM

## 2019-08-29 DIAGNOSIS — C50919 Malignant neoplasm of unspecified site of unspecified female breast: Secondary | ICD-10-CM | POA: Diagnosis not present

## 2019-08-29 DIAGNOSIS — F902 Attention-deficit hyperactivity disorder, combined type: Secondary | ICD-10-CM | POA: Diagnosis not present

## 2019-08-29 DIAGNOSIS — Z9221 Personal history of antineoplastic chemotherapy: Secondary | ICD-10-CM

## 2019-08-29 DIAGNOSIS — J302 Other seasonal allergic rhinitis: Secondary | ICD-10-CM

## 2019-08-29 MED ORDER — DIAZEPAM 10 MG PO TABS: 10.0000 mg | ORAL_TABLET | Freq: Every day | ORAL | 2 refills | Status: DC

## 2019-08-29 MED ORDER — AMPHETAMINE-DEXTROAMPHETAMINE 20 MG PO TABS: 20.0000 mg | ORAL_TABLET | Freq: Two times a day (BID) | ORAL | 0 refills | Status: DC

## 2019-08-29 MED ORDER — DIAZEPAM 10 MG PO TABS: 10.0000 mg | ORAL_TABLET | Freq: Two times a day (BID) | ORAL | 1 refills | Status: DC | PRN

## 2019-08-31 ENCOUNTER — Other Ambulatory Visit: Payer: Self-pay | Admitting: Family Medicine

## 2019-08-31 ENCOUNTER — Encounter: Payer: Self-pay | Admitting: Family Medicine

## 2019-08-31 DIAGNOSIS — S82899D Other fracture of unspecified lower leg, subsequent encounter for closed fracture with routine healing: Secondary | ICD-10-CM | POA: Insufficient documentation

## 2019-08-31 DIAGNOSIS — F4329 Adjustment disorder with other symptoms: Secondary | ICD-10-CM

## 2019-08-31 MED ORDER — FLUTICASONE PROPIONATE 50 MCG/ACT NA SUSP: NASAL | 3 refills | Status: DC

## 2019-08-31 MED ORDER — VENLAFAXINE HCL 37.5 MG PO TABS: 37.5000 mg | ORAL_TABLET | Freq: Two times a day (BID) | ORAL | 3 refills | Status: DC

## 2019-08-31 MED ORDER — AMPHETAMINE-DEXTROAMPHETAMINE 20 MG PO TABS: 20.0000 mg | ORAL_TABLET | Freq: Two times a day (BID) | ORAL | 0 refills | Status: DC

## 2019-08-31 MED ORDER — DIAZEPAM 10 MG PO TABS: 10.0000 mg | ORAL_TABLET | Freq: Every day | ORAL | 2 refills | Status: DC

## 2019-08-31 MED ORDER — GABAPENTIN 100 MG PO CAPS: ORAL_CAPSULE | ORAL | 0 refills | Status: DC

## 2019-08-31 NOTE — Patient Instructions (Signed)
If you ever need me: [email or web address] [email or web address].   I'll miss you! EW 

## 2019-09-29 ENCOUNTER — Other Ambulatory Visit: Payer: Self-pay | Admitting: Family Medicine

## 2019-09-29 DIAGNOSIS — J302 Other seasonal allergic rhinitis: Secondary | ICD-10-CM

## 2019-10-31 DIAGNOSIS — C50919 Malignant neoplasm of unspecified site of unspecified female breast: Secondary | ICD-10-CM | POA: Diagnosis not present

## 2019-11-01 DIAGNOSIS — C50919 Malignant neoplasm of unspecified site of unspecified female breast: Secondary | ICD-10-CM | POA: Diagnosis not present

## 2019-11-02 DIAGNOSIS — C50919 Malignant neoplasm of unspecified site of unspecified female breast: Secondary | ICD-10-CM | POA: Diagnosis not present

## 2019-11-03 DIAGNOSIS — C50919 Malignant neoplasm of unspecified site of unspecified female breast: Secondary | ICD-10-CM | POA: Diagnosis not present

## 2019-11-04 DIAGNOSIS — C50919 Malignant neoplasm of unspecified site of unspecified female breast: Secondary | ICD-10-CM | POA: Diagnosis not present

## 2019-11-05 DIAGNOSIS — C50919 Malignant neoplasm of unspecified site of unspecified female breast: Secondary | ICD-10-CM | POA: Diagnosis not present

## 2019-11-06 DIAGNOSIS — C50919 Malignant neoplasm of unspecified site of unspecified female breast: Secondary | ICD-10-CM | POA: Diagnosis not present

## 2019-11-08 DIAGNOSIS — C50919 Malignant neoplasm of unspecified site of unspecified female breast: Secondary | ICD-10-CM | POA: Diagnosis not present

## 2019-11-09 DIAGNOSIS — C50919 Malignant neoplasm of unspecified site of unspecified female breast: Secondary | ICD-10-CM | POA: Diagnosis not present

## 2019-11-10 DIAGNOSIS — C50919 Malignant neoplasm of unspecified site of unspecified female breast: Secondary | ICD-10-CM | POA: Diagnosis not present

## 2019-11-11 DIAGNOSIS — C50919 Malignant neoplasm of unspecified site of unspecified female breast: Secondary | ICD-10-CM | POA: Diagnosis not present

## 2019-11-12 DIAGNOSIS — C50919 Malignant neoplasm of unspecified site of unspecified female breast: Secondary | ICD-10-CM | POA: Diagnosis not present

## 2019-11-13 DIAGNOSIS — C50919 Malignant neoplasm of unspecified site of unspecified female breast: Secondary | ICD-10-CM | POA: Diagnosis not present

## 2019-11-19 DIAGNOSIS — M25572 Pain in left ankle and joints of left foot: Secondary | ICD-10-CM | POA: Diagnosis not present

## 2019-11-21 ENCOUNTER — Telehealth: Payer: Self-pay | Admitting: Family Medicine

## 2019-11-21 NOTE — Telephone Encounter (Signed)
Patient needs refill on diazapam and adderall.   Patient will run out of meds on 11/28/19. She has a TOC appointment scheduled on 02/15. Patient uses pharmacy [facility] [address].

## 2019-11-24 ENCOUNTER — Other Ambulatory Visit: Payer: Self-pay

## 2019-11-24 DIAGNOSIS — M25572 Pain in left ankle and joints of left foot: Secondary | ICD-10-CM | POA: Diagnosis not present

## 2019-11-24 DIAGNOSIS — S96812A Strain of other specified muscles and tendons at ankle and foot level, left foot, initial encounter: Secondary | ICD-10-CM | POA: Diagnosis not present

## 2019-11-24 DIAGNOSIS — S93422A Sprain of deltoid ligament of left ankle, initial encounter: Secondary | ICD-10-CM | POA: Diagnosis not present

## 2019-11-24 DIAGNOSIS — S86312A Strain of muscle(s) and tendon(s) of peroneal muscle group at lower leg level, left leg, initial encounter: Secondary | ICD-10-CM | POA: Diagnosis not present

## 2019-11-24 DIAGNOSIS — F902 Attention-deficit hyperactivity disorder, combined type: Secondary | ICD-10-CM

## 2019-11-24 DIAGNOSIS — M659 Synovitis and tenosynovitis, unspecified: Secondary | ICD-10-CM | POA: Diagnosis not present

## 2019-11-24 DIAGNOSIS — X58XXXA Exposure to other specified factors, initial encounter: Secondary | ICD-10-CM | POA: Diagnosis not present

## 2019-11-24 DIAGNOSIS — M65872 Other synovitis and tenosynovitis, left ankle and foot: Secondary | ICD-10-CM | POA: Diagnosis not present

## 2019-11-24 DIAGNOSIS — M62838 Other muscle spasm: Secondary | ICD-10-CM

## 2019-11-24 DIAGNOSIS — S93412A Sprain of calcaneofibular ligament of left ankle, initial encounter: Secondary | ICD-10-CM | POA: Diagnosis not present

## 2019-11-24 MED ORDER — AMPHETAMINE-DEXTROAMPHETAMINE 20 MG PO TABS: 20.0000 mg | ORAL_TABLET | Freq: Two times a day (BID) | ORAL | 0 refills | Status: DC

## 2019-11-24 MED ORDER — DIAZEPAM 10 MG PO TABS: 10.0000 mg | ORAL_TABLET | Freq: Two times a day (BID) | ORAL | 1 refills | Status: DC | PRN

## 2019-11-24 NOTE — Telephone Encounter (Signed)
Reviewed chart.  Refilled adderall for next 2 months.  Refilled diazepam for next 2 months.  Has TOC with me on 2/15.2021.

## 2019-11-24 NOTE — Telephone Encounter (Signed)
Refill requests sent to Dr. Jonni Sanger.

## 2019-12-08 ENCOUNTER — Other Ambulatory Visit: Payer: Self-pay

## 2019-12-08 DIAGNOSIS — F4329 Adjustment disorder with other symptoms: Secondary | ICD-10-CM

## 2019-12-08 MED ORDER — VENLAFAXINE HCL 37.5 MG PO TABS: 37.5000 mg | ORAL_TABLET | Freq: Two times a day (BID) | ORAL | 3 refills | Status: DC

## 2019-12-19 DIAGNOSIS — M7752 Other enthesopathy of left foot: Secondary | ICD-10-CM | POA: Diagnosis not present

## 2019-12-19 DIAGNOSIS — M67979 Unspecified disorder of synovium and tendon, unspecified ankle and foot: Secondary | ICD-10-CM | POA: Diagnosis not present

## 2019-12-19 DIAGNOSIS — S86312A Strain of muscle(s) and tendon(s) of peroneal muscle group at lower leg level, left leg, initial encounter: Secondary | ICD-10-CM | POA: Diagnosis not present

## 2019-12-19 DIAGNOSIS — S93492A Sprain of other ligament of left ankle, initial encounter: Secondary | ICD-10-CM | POA: Diagnosis not present

## 2019-12-24 ENCOUNTER — Telehealth: Payer: Self-pay

## 2019-12-24 ENCOUNTER — Other Ambulatory Visit: Payer: Self-pay

## 2019-12-24 DIAGNOSIS — F902 Attention-deficit hyperactivity disorder, combined type: Secondary | ICD-10-CM

## 2019-12-24 NOTE — Telephone Encounter (Signed)
Patient calling because she would like to know could her TOC visit be virtual because she is having Botox injection on her ankle l done on 12/26/19. The doctor told her she wouldn't be able to walk due to the swelling.

## 2019-12-24 NOTE — Telephone Encounter (Signed)
MEDICATION:amphetamine-dextroamphetamine (ADDERALL) 20 MG tablet  PHARMACY: [id number] [place] [address] Phone:  [phone]  Fax:  [phone]       Comments:    **Let patient know to contact pharmacy at the end of the day to make sure medication is ready. **  ** Please notify patient to allow 48-72 hours to process**  **Encourage patient to contact the pharmacy for refills or they can request refills through [facility]**

## 2019-12-24 NOTE — Telephone Encounter (Signed)
Please advise 

## 2019-12-24 NOTE — Telephone Encounter (Signed)
LAST APPOINTMENT DATE: 08/29/2019  NEXT APPOINTMENT DATE:@2 /15/2021   LAST REFILL: 11/24/2019  QTY:

## 2019-12-24 NOTE — Telephone Encounter (Signed)
See below

## 2019-12-24 NOTE — Telephone Encounter (Signed)
Yes

## 2019-12-24 NOTE — Telephone Encounter (Signed)
Refill request sent to Dr. Andy  

## 2019-12-25 MED ORDER — AMPHETAMINE-DEXTROAMPHETAMINE 20 MG PO TABS: 20.0000 mg | ORAL_TABLET | Freq: Two times a day (BID) | ORAL | 0 refills | Status: DC

## 2019-12-25 NOTE — Telephone Encounter (Signed)
12/29/2019 Refilled #30

## 2019-12-29 ENCOUNTER — Encounter: Payer: Self-pay | Admitting: Family Medicine

## 2019-12-29 ENCOUNTER — Other Ambulatory Visit: Payer: Self-pay

## 2019-12-29 ENCOUNTER — Ambulatory Visit (INDEPENDENT_AMBULATORY_CARE_PROVIDER_SITE_OTHER): Payer: BC Managed Care – PPO | Admitting: Family Medicine

## 2019-12-29 VITALS — Ht 69.0 in | Wt 165.0 lb

## 2019-12-29 DIAGNOSIS — F902 Attention-deficit hyperactivity disorder, combined type: Secondary | ICD-10-CM | POA: Diagnosis not present

## 2019-12-29 DIAGNOSIS — N951 Menopausal and female climacteric states: Secondary | ICD-10-CM

## 2019-12-29 DIAGNOSIS — Z853 Personal history of malignant neoplasm of breast: Secondary | ICD-10-CM

## 2019-12-29 DIAGNOSIS — F339 Major depressive disorder, recurrent, unspecified: Secondary | ICD-10-CM | POA: Diagnosis not present

## 2019-12-29 DIAGNOSIS — M62838 Other muscle spasm: Secondary | ICD-10-CM

## 2019-12-29 DIAGNOSIS — S86302S Unspecified injury of muscle(s) and tendon(s) of peroneal muscle group at lower leg level, left leg, sequela: Secondary | ICD-10-CM

## 2019-12-29 DIAGNOSIS — Z79899 Other long term (current) drug therapy: Secondary | ICD-10-CM

## 2019-12-29 DIAGNOSIS — Z800 Family history of malignant neoplasm of digestive organs: Secondary | ICD-10-CM

## 2019-12-29 DIAGNOSIS — H1010 Acute atopic conjunctivitis, unspecified eye: Secondary | ICD-10-CM

## 2019-12-29 DIAGNOSIS — H1013 Acute atopic conjunctivitis, bilateral: Secondary | ICD-10-CM

## 2019-12-29 HISTORY — DX: Acute atopic conjunctivitis, unspecified eye: H10.10

## 2019-12-29 HISTORY — DX: Family history of malignant neoplasm of digestive organs: Z80.0

## 2019-12-29 MED ORDER — DIAZEPAM 10 MG PO TABS: 10.0000 mg | ORAL_TABLET | Freq: Every evening | ORAL | 3 refills | Status: DC | PRN

## 2019-12-29 MED ORDER — AMPHETAMINE-DEXTROAMPHETAMINE 20 MG PO TABS: 20.0000 mg | ORAL_TABLET | Freq: Two times a day (BID) | ORAL | 0 refills | Status: DC

## 2019-12-29 MED ORDER — VENLAFAXINE HCL 75 MG PO TABS: 75.0000 mg | ORAL_TABLET | Freq: Every day | ORAL | 3 refills | Status: DC

## 2019-12-29 NOTE — Progress Notes (Signed)
Virtual Visit via Video Note  Subjective  CC:  Chief Complaint  Patient presents with  . Transitions Of Care    TOC from Dr. Juleen China  . Premenstrual Syndrome    having symptoms like period is about to start, but hasn't had a menstral period in almost 10 months     I connected with Natasha Mcclain on 12/29/19 at  8:00 AM EST by a video enabled telemedicine application and verified that I am speaking with the correct person using two identifiers. Location patient: Home Location provider: [facility], Office Persons participating in the virtual visit: Natasha Mcclain, [name], MD Serita Sheller, [place] discussed the limitations of evaluation and management by telemedicine and the availability of in person appointments. The patient expressed understanding and agreed to proceed. HPI: Natasha Mcclain is a 54 y.o. female who was contacted today to address the problems listed above in the chief complaint. . 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 Reviewed pmh . ADD: worse symptoms at home since constant ankle pain and at home all day. Sleeping well. Writes everything done but still struggles. Only taking 2nd dose intermittently; at times late and at times half dose.  . Neck muscle spasm uses valium . I reviewed patient's records from the PMP aware controlled substance registry today.  . Mood disorder: chronic depression since breast cancer; had been down to very low dose effexor but now doing worse again, in part due to pandemic and being unemployed. Increased her dose of effexor to 75mg  daily. Mildly improved. But struggling due to prolonged course with left ankle injury. Now being cared by [facility] . Left ankle injury and chronic pain: gabapentin at night. Seeing [facility] sports med and will be receiving serum plasma injections soon. Has been debilitating.  . Chronic allergies on meds; controlled.  Marland Kitchen HM: sees [facility] gyn onc for mammo and pap. Due for pap. 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  . Perimenopausal: last menses 10 months ago. Feels sxs of hormonal changes. No hot flushes.   Assessment  1. Attention deficit hyperactivity disorder (ADHD), combined type   2. Muscle spasms of neck   3. Major depression, recurrent, chronic ([place])   4. Chronic prescription benzodiazepine use   5. Injury of peroneal tendon of left foot, sequela   6. Allergic conjunctivitis of both eyes   7. Perimenopausal   8. Family history of colon cancer   9. History of left breast cancer 2009      Plan   ADD:  Refilled meds: will monitor due to mildly worsening sxs. rec taking bid dosing regularly; recheck 3 months and consider changing to XR formulation if not improving.   Mm spasms on chronic benzo, refilled  Depression: active but improved. Continue effexor at 75mg  and recheck next visit  Left ankle injury: main issue per [facility]  Perimenopausal: reassured. Will schedule visit w/ gyn/onc for pelvic and pap.   Family history of breast cancer and colon cancer. Nl colonoscopy and recent mammo. Has f/u at [facility] with gyn onc. Needs pap smear  This visit consisted of 40 minutes of virtual visit with patient discussing all of her medical problems and concerns; I also spent 10-15 minutes reviewed her records including [facility] records, labs, pap and mammo results. Ankle reports.  I discussed the assessment and treatment plan with the patient. The patient was provided an opportunity to ask questions and all were answered. The patient  agreed with the plan and demonstrated an understanding of the instructions.   The patient was advised to call back or seek an in-person evaluation if the symptoms worsen or if the condition fails to improve as anticipated. Follow up: No follow-ups on file.  Visit date not found  Meds ordered this encounter  Medications  . venlafaxine (EFFEXOR) 75 MG tablet    Sig: Take 1 tablet (75 mg total) by mouth daily.    Dispense:  90 tablet     Refill:  3  . DISCONTD: amphetamine-dextroamphetamine (ADDERALL) 20 MG tablet    Sig: Take 1 tablet (20 mg total) by mouth 2 (two) times daily.    Dispense:  60 tablet    Refill:  0  . diazepam (VALIUM) 10 MG tablet    Sig: Take 1 tablet (10 mg total) by mouth at bedtime as needed for anxiety.    Dispense:  90 tablet    Refill:  3    This request is for a new prescription for a controlled substance as required by Federal/State law. DX Code Needed  .  Marland Kitchen DISCONTD: amphetamine-dextroamphetamine (ADDERALL) 20 MG tablet    Sig: Take 1 tablet (20 mg total) by mouth 2 (two) times daily.    Dispense:  60 tablet    Refill:  0    Please hold for pt  . amphetamine-dextroamphetamine (ADDERALL) 20 MG tablet    Sig: Take 1 tablet (20 mg total) by mouth 2 (two) times daily.    Dispense:  60 tablet    Refill:  0    Please hold for pt      I reviewed the patients updated PMH, FH, and SocHx.    Patient Active Problem List   Diagnosis Date Noted  . Chronic prescription benzodiazepine use 12/29/2019    Priority: High  . Attention deficit hyperactivity disorder (ADHD), combined type 01/13/2019    Priority: High  . History of left breast cancer 2009 08/13/2013    Priority: High  . Major depression, recurrent, chronic ([place]) 07/06/2011    Priority: High  . Family history of colon cancer 12/29/2019    Priority: Medium  . Muscle spasms of neck 01/13/2019    Priority: Medium  . Seasonal allergies 01/13/2019    Priority: Low  . Ovarian cyst 01/06/2011    Priority: Low  . Allergic conjunctivitis 12/29/2019  . Perimenopausal 12/29/2019   Current Meds  Medication Sig  . Cetirizine HCl 10 MG CAPS Take 1 capsule (10 mg total) by mouth daily.  . diazepam (VALIUM) 10 MG tablet Take 1 tablet (10 mg total) by mouth at bedtime as needed for anxiety.  . fluticasone (FLONASE) 50 MCG/ACT nasal spray SPRAY 2 SPRAYS INTO EACH NOSTRIL EVERY DAY  . gabapentin (NEURONTIN) 100 MG capsule TAKE 1-3 CAPSULES BY  MOUTH AT BEDTIME  . Olopatadine HCl 0.2 % SOLN ADMINISTER 1 DROP TO BOTH EYES TWO TIMES A DAY  . venlafaxine (EFFEXOR) 75 MG tablet Take 1 tablet (75 mg total) by mouth daily.  . [DISCONTINUED] amphetamine-dextroamphetamine (ADDERALL) 20 MG tablet Take 1 tablet (20 mg total) by mouth 2 (two) times daily.  . [DISCONTINUED] amphetamine-dextroamphetamine (ADDERALL) 20 MG tablet Take 1 tablet (20 mg total) by mouth 2 (two) times daily.  . [DISCONTINUED] amphetamine-dextroamphetamine (ADDERALL) 20 MG tablet Take 1 tablet (20 mg total) by mouth 2 (two) times daily.  . [DISCONTINUED] amphetamine-dextroamphetamine (ADDERALL) 20 MG tablet Take 1 tablet (20 mg total) by mouth 2 (two) times daily.  . [  DISCONTINUED] amphetamine-dextroamphetamine (ADDERALL) 20 MG tablet Take 1 tablet (20 mg total) by mouth 2 (two) times daily.  . [DISCONTINUED] diazepam (VALIUM) 10 MG tablet Take 1 tablet (10 mg total) by mouth daily.  . [DISCONTINUED] venlafaxine (EFFEXOR) 37.5 MG tablet Take 1 tablet (37.5 mg total) by mouth 2 (two) times daily.  Derrill Memo ON 03/23/2020] amphetamine-dextroamphetamine (ADDERALL) 20 MG tablet Take 1 tablet (20 mg total) by mouth 2 (two) times daily.    Allergies: Patient is allergic to paclitaxel; short ragweed pollen ext; tramadol; and tape. Family History: Patient family history includes Breast cancer in her mother; Cancer in her sister; Colon cancer in her cousin; Diabetes in her brother, mother, and sister; Early death in her father and mother; Hypertension in her sister; Miscarriages / Stillbirths in her mother. Social History:  Patient  reports that she has never smoked. She has never used smokeless tobacco. She reports current alcohol use. She reports previous drug use.  Review of Systems: Constitutional: Negative for fever malaise or anorexia Cardiovascular: negative for chest pain Respiratory: negative for SOB or persistent cough Gastrointestinal: negative for abdominal  pain  OBJECTIVE Vitals: Ht 5\' 9"  (1.753 m)   Wt 165 lb (74.8 kg)   BMI 24.37 kg/m  General: no acute distress , A&Ox3  [name], MD       I discussed the limitations of evaluation and management by telemedicine and the availability of in person appointments. The patient expressed understanding and agreed to proceed.

## 2020-01-09 DIAGNOSIS — S86312D Strain of muscle(s) and tendon(s) of peroneal muscle group at lower leg level, left leg, subsequent encounter: Secondary | ICD-10-CM | POA: Diagnosis not present

## 2020-02-17 ENCOUNTER — Other Ambulatory Visit: Payer: Self-pay | Admitting: Family Medicine

## 2020-02-17 DIAGNOSIS — J302 Other seasonal allergic rhinitis: Secondary | ICD-10-CM

## 2020-03-19 ENCOUNTER — Encounter: Payer: Self-pay | Admitting: Family Medicine

## 2020-03-19 NOTE — Telephone Encounter (Signed)
Please advise 

## 2020-03-25 ENCOUNTER — Other Ambulatory Visit: Payer: Self-pay

## 2020-03-25 DIAGNOSIS — H1013 Acute atopic conjunctivitis, bilateral: Secondary | ICD-10-CM

## 2020-03-25 DIAGNOSIS — J302 Other seasonal allergic rhinitis: Secondary | ICD-10-CM

## 2020-03-26 ENCOUNTER — Telehealth: Payer: Self-pay | Admitting: Allergy

## 2020-03-26 NOTE — Telephone Encounter (Signed)
She was originally referred for seasonal allergies and allergic conjuntivitis in both eyes, but she states this in not what the referral should be for.

## 2020-03-26 NOTE — Telephone Encounter (Signed)
Caryl Pina, I received a call yesterday evening, right at 5:00, from this patient. She was referred to [place], but talking to her, she needed to be seen for something different than her referral stated. She was thinking of getting the Covid vaccine, but she remembered, back a few years ago, that she was told she was allergic to MRNA, or the vessel it is given in. She has not had her shot yet. Could you call and talk with her.

## 2020-03-26 NOTE — Telephone Encounter (Signed)
Called and left a voicemail asking for patient to return call to discuss her concerns with receiving the COVID vaccine in order to determine her referral to our office.

## 2020-03-26 NOTE — Telephone Encounter (Signed)
What was her original referral for? There are some things that I may not be able to give the best advice on in those terms, it may be best for her to schedule for a new patient appointment to see the provider first to be honest. It doesn't necessarily mean that she has to be scheduled for COVID component testing for her first visit.

## 2020-03-30 NOTE — Telephone Encounter (Signed)
Called and left a voicemail asking for patient to return call to discuss.  °

## 2020-04-02 NOTE — Telephone Encounter (Signed)
Patient called back and stated that she was referred to our office because she had a reaction to a Chemo drug back in 2009 called Taxol. She stated that she immediately had anaphylaxis and they believe that it was not the drug itself but the delivery mechanism/ additive in the drug. A pharmacist called it PEG? She was doing research and noticed that PEG additive is in the MRNA COVID vaccine. She is wondering if she can get just the [place] vaccine and be ok or if she needs to avoid the COVID vaccines all together? She stated that she has had the flu shot in the past many times without issues but once she did get the flu shot and the tetanus vaccine and states that she had a terrible reaction. She states that she had flu like symptoms, and heart palpations and can't remember if she had hives or difficulty breathing. Please advise.

## 2020-04-02 NOTE — Telephone Encounter (Signed)
Called patient and advised. Patient verbalized understanding and was very thankful. She stated she will call back if she needs anything further.

## 2020-04-02 NOTE — Telephone Encounter (Signed)
Yes that is a good idea. That is exactly what I would recommend since the J&J contains polysorbate, which is the stabilizer within influenza vaccinations.   She also is at low risk of the clotting disorders seen rarely with the J&J vaccination.  Salvatore Marvel, MD [facility]

## 2020-04-19 ENCOUNTER — Other Ambulatory Visit: Payer: Self-pay | Admitting: Family Medicine

## 2020-04-19 DIAGNOSIS — F902 Attention-deficit hyperactivity disorder, combined type: Secondary | ICD-10-CM

## 2020-04-19 MED ORDER — AMPHETAMINE-DEXTROAMPHETAMINE 20 MG PO TABS: 20.0000 mg | ORAL_TABLET | Freq: Two times a day (BID) | ORAL | 0 refills | Status: DC

## 2020-04-19 NOTE — Telephone Encounter (Signed)
  LAST APPOINTMENT DATE: 12/29/2019  NEXT APPOINTMENT DATE:@7 /05/2020  MEDICATION:amphetamine-dextroamphetamine (ADDERALL) 20 MG tablet  PH[facility]ACY:[id number] [place], [place] - [address]. AT [place] is aware that Natasha Mcclain needs an appointment but was unable to make one due to work reasons, and wanted to know if Natasha Mcclain could just get enough to her appt in 05/19/20 and would really prefer to have her vaccine before coming in office.   **Let patient know to contact pharmacy at the end of the day to make sure medication is ready. **  ** Please notify patient to allow 48-72 hours to process**  **Encourage patient to contact the pharmacy for refills or they can request refills through [facility]**  CLINICAL FILLS OUT ALL BELOW:   LAST REFILL:  QTY:  REFILL DATE:    OTHER COMMENTS:    Okay for refill?  Please advise

## 2020-04-19 NOTE — Telephone Encounter (Signed)
Last refill: 03/23/20 #60, 0 Last OV: 12/29/19 dx. ADHD

## 2020-05-19 ENCOUNTER — Other Ambulatory Visit: Payer: Self-pay

## 2020-05-19 ENCOUNTER — Encounter: Payer: Self-pay | Admitting: Family Medicine

## 2020-05-19 ENCOUNTER — Ambulatory Visit (INDEPENDENT_AMBULATORY_CARE_PROVIDER_SITE_OTHER): Payer: BC Managed Care – PPO | Admitting: Family Medicine

## 2020-05-19 VITALS — BP 118/72 | Temp 98.1°F | Resp 18 | Ht 69.0 in | Wt 157.8 lb

## 2020-05-19 DIAGNOSIS — F339 Major depressive disorder, recurrent, unspecified: Secondary | ICD-10-CM | POA: Diagnosis not present

## 2020-05-19 DIAGNOSIS — Z79899 Other long term (current) drug therapy: Secondary | ICD-10-CM

## 2020-05-19 DIAGNOSIS — N63 Unspecified lump in unspecified breast: Secondary | ICD-10-CM | POA: Diagnosis not present

## 2020-05-19 DIAGNOSIS — F902 Attention-deficit hyperactivity disorder, combined type: Secondary | ICD-10-CM

## 2020-05-19 DIAGNOSIS — Z853 Personal history of malignant neoplasm of breast: Secondary | ICD-10-CM

## 2020-05-19 MED ORDER — AMPHETAMINE-DEXTROAMPHETAMINE 20 MG PO TABS: 20.0000 mg | ORAL_TABLET | Freq: Two times a day (BID) | ORAL | 0 refills | Status: DC

## 2020-05-19 NOTE — Patient Instructions (Signed)
Please return in 3-4 months for your annual complete physical with pap smear and ADD f/u.; please come fasting.   If you have any questions or concerns, please don't hesitate to send me a message via MyChart or call the office at [phone]. Thank you for visiting with [place] today! It's our pleasure caring for you.

## 2020-05-19 NOTE — Progress Notes (Signed)
Subjective  CC:  Chief Complaint  Patient presents with  . ADD  . Breast concerns    She would like to you to feel her left breast due to it feeling like there is a pebble. No pain.  Marland Kitchen Health Maintenance    She has a upcoming mammogram at [place].    HPI: Natasha Mcclain is a 54 y.o. female who presents to the office today to address the problems listed above in the chief complaint.   Patient is here today for follow up of ADD/ADHD. She is taking medication as directed and continues to feel it is beneficial. The medications continue to help with focus and attention and task completion.  She continues to have some late afternoon, early evening symptoms of poor focus however she feels her medications are working appropriately. She denies adverse side effects; specifically no headaches, appetite suppression, weight loss, sleeping difficulty, heart palpitations, chest pain or significant weight changes.  This patient does not have contraindications for stimulant use include hypertension, tachycardia, arrhythmia, psychosis, bipolar disorder, severe anorexia, and Tourette syndrome.  Depression: Fortunately her mood is much improved.  Last visit was in February and she was not doing well prior partly due to her low mood and more because of her ankle pain.  She has had significant improvement in her ankle pain and this has helped her mood.  She feels her medication is working.  Muscle spasm on chronic benzos.  I reviewed the controlled substance registry today and it is appropriate.  History of breast cancer status post left mastectomy with implant present.  She reports a 4 to 6-week history of burning a small pebble-like mass superficially over the left breast.  She does have an appointment with her oncologist within the next month for follow-up mammogram and examination.  Health maintenance: She will be due for her physical in September.  She is due for a Pap smear at that time.  She does have  a gynecologist oncologist at [facility].  She may try to follow-up there.  Assessment  1. Attention deficit hyperactivity disorder (ADHD), combined type   2. Major depression, recurrent, chronic ([place])   3. Chronic prescription benzodiazepine use   4. Breast mass   5. History of left breast cancer 2009      Plan   ADD: Fairly well controlled.  At this time she went to continue on her current dose and medicines have been refilled.  Continue short acting twice a day medication.  We will continue to monitor for late afternoon breakthrough symptoms.  Major depression is well controlled currently.  Significantly improved from last visit.  She feels good about this.  Continue to monitor.  Chronic benzo use for muscle spasm per prior PCP.  No changes made today.  Superficial skin mass over left breast implant.  Reassured.  She will schedule her mammogram and follow-up with oncology next month.  Return for complete physical with Pap smear if she does not have one with her GYN oncologist I reviewed patient's records from the PMP aware controlled substance registry today.   Follow up: 3 to 4 months for CPE with Pap smear and follow-up ADD No orders of the defined types were placed in this encounter.  Meds ordered this encounter  Medications  . amphetamine-dextroamphetamine (ADDERALL) 20 MG tablet    Sig: Take 1 tablet (20 mg total) by mouth 2 (two) times daily.    Dispense:  60 tablet    Refill:  0  Please hold for pt  . amphetamine-dextroamphetamine (ADDERALL) 20 MG tablet    Sig: Take 1 tablet (20 mg total) by mouth 2 (two) times daily.    Dispense:  60 tablet    Refill:  0    Please keep on file for next refill due august  . amphetamine-dextroamphetamine (ADDERALL) 20 MG tablet    Sig: Take 1 tablet (20 mg total) by mouth 2 (two) times daily.    Dispense:  60 tablet    Refill:  0    Please keep on file for next refill due September      I reviewed the patients updated PMH,  FH, and SocHx.    Patient Active Problem List   Diagnosis Date Noted  . Chronic prescription benzodiazepine use 12/29/2019    Priority: High  . Attention deficit hyperactivity disorder (ADHD), combined type 01/13/2019    Priority: High  . History of left breast cancer 2009 08/13/2013    Priority: High  . Major depression, recurrent, chronic ([place]) 07/06/2011    Priority: High  . Family history of colon cancer 12/29/2019    Priority: Medium  . Muscle spasms of neck 01/13/2019    Priority: Medium  . Allergic conjunctivitis 12/29/2019    Priority: Low  . Seasonal allergies 01/13/2019    Priority: Low  . Ovarian cyst 01/06/2011    Priority: Low  . Perimenopausal 12/29/2019   Current Meds  Medication Sig  . amphetamine-dextroamphetamine (ADDERALL) 20 MG tablet Take 1 tablet (20 mg total) by mouth 2 (two) times daily.  Derrill Memo ON 06/18/2020] amphetamine-dextroamphetamine (ADDERALL) 20 MG tablet Take 1 tablet (20 mg total) by mouth 2 (two) times daily.  Derrill Memo ON 07/18/2020] amphetamine-dextroamphetamine (ADDERALL) 20 MG tablet Take 1 tablet (20 mg total) by mouth 2 (two) times daily.  . Cetirizine HCl 10 MG CAPS Take 1 capsule (10 mg total) by mouth daily.  . diazepam (VALIUM) 10 MG tablet Take 1 tablet (10 mg total) by mouth at bedtime as needed for anxiety.  . fluticasone (FLONASE) 50 MCG/ACT nasal spray SPRAY 2 SPRAYS INTO EACH NOSTRIL EVERY DAY  . gabapentin (NEURONTIN) 100 MG capsule TAKE 1-3 CAPSULES BY MOUTH AT BEDTIME  . Olopatadine HCl 0.2 % SOLN ADMINISTER 1 DROP TO BOTH EYES TWO TIMES A DAY  . venlafaxine (EFFEXOR) 75 MG tablet Take 1 tablet (75 mg total) by mouth daily.  . [DISCONTINUED] amphetamine-dextroamphetamine (ADDERALL) 20 MG tablet Take 1 tablet (20 mg total) by mouth 2 (two) times daily.    Allergies: Patient is allergic to paclitaxel, short ragweed pollen ext, tramadol, and tape. Family History: Patient family history includes Breast cancer in her mother;  Cancer in her sister; Colon cancer in her cousin; Diabetes in her brother, mother, and sister; Early death in her father and mother; Hypertension in her sister; Miscarriages / Stillbirths in her mother. Social History:  Patient  reports that she has never smoked. She has never used smokeless tobacco. She reports current alcohol use. She reports previous drug use.  Review of Systems: Constitutional: Negative for fever malaise or anorexia Cardiovascular: negative for chest pain Respiratory: negative for SOB or persistent cough Gastrointestinal: negative for abdominal pain  Objective  Vitals: BP 118/72   Temp 98.1 F (36.7 C) (Temporal)   Resp 18   Ht 5\' 9"  (1.753 m)   Wt 157 lb 12.8 oz (71.6 kg)   BMI 23.30 kg/m  General: no acute distress , A&Ox3 Psych: Appropriate mood, normal affect Breast: Status post  left mastectomy, left breast implant present, central 3 mm superficial nontender mass present, dermal. Cardiovascular:  RRR without murmur or gallop.  Respiratory:  Good breath sounds bilaterally, CTAB with normal respiratory effort Skin:  Warm, no rashes    Commons side effects, risks, benefits, and alternatives for medications and treatment plan prescribed today were discussed, and the patient expressed understanding of the given instructions. Patient is instructed to call or message via MyChart if he/she has any questions or concerns regarding our treatment plan. No barriers to understanding were identified. We discussed Red Flag symptoms and signs in detail. Patient expressed understanding regarding what to do in case of urgent or emergency type symptoms.   Medication list was reconciled, printed and provided to the patient in AVS. Patient instructions and summary information was reviewed with the patient as documented in the AVS. This note was prepared with assistance of Dragon voice recognition software. Occasional wrong-word or sound-a-like substitutions may have occurred due to  the inherent limitations of voice recognition software

## 2020-05-27 ENCOUNTER — Other Ambulatory Visit: Payer: Self-pay | Admitting: Family Medicine

## 2020-05-27 DIAGNOSIS — M25572 Pain in left ankle and joints of left foot: Secondary | ICD-10-CM

## 2020-05-27 DIAGNOSIS — S82892D Other fracture of left lower leg, subsequent encounter for closed fracture with routine healing: Secondary | ICD-10-CM

## 2020-05-27 NOTE — Telephone Encounter (Signed)
OK to refill? Script ran out in January

## 2020-05-28 DIAGNOSIS — N921 Excessive and frequent menstruation with irregular cycle: Secondary | ICD-10-CM | POA: Diagnosis not present

## 2020-05-28 DIAGNOSIS — C50912 Malignant neoplasm of unspecified site of left female breast: Secondary | ICD-10-CM | POA: Diagnosis not present

## 2020-05-28 DIAGNOSIS — Z6823 Body mass index (BMI) 23.0-23.9, adult: Secondary | ICD-10-CM | POA: Diagnosis not present

## 2020-05-28 DIAGNOSIS — C50919 Malignant neoplasm of unspecified site of unspecified female breast: Secondary | ICD-10-CM | POA: Diagnosis not present

## 2020-05-28 DIAGNOSIS — R922 Inconclusive mammogram: Secondary | ICD-10-CM | POA: Diagnosis not present

## 2020-05-28 DIAGNOSIS — Z170 Estrogen receptor positive status [ER+]: Secondary | ICD-10-CM | POA: Diagnosis not present

## 2020-05-28 NOTE — Telephone Encounter (Signed)
Can you please clarify what she takes this for? From chart review, I can't tell. It is linked to ankle fracture?

## 2020-05-28 NOTE — Telephone Encounter (Signed)
Patient is taking Gabapentin for continuous ankle pain from fracture

## 2020-06-16 DIAGNOSIS — C50919 Malignant neoplasm of unspecified site of unspecified female breast: Secondary | ICD-10-CM | POA: Diagnosis not present

## 2020-06-16 DIAGNOSIS — Z6822 Body mass index (BMI) 22.0-22.9, adult: Secondary | ICD-10-CM | POA: Diagnosis not present

## 2020-06-16 DIAGNOSIS — Z170 Estrogen receptor positive status [ER+]: Secondary | ICD-10-CM | POA: Diagnosis not present

## 2020-07-01 DIAGNOSIS — Z01419 Encounter for gynecological examination (general) (routine) without abnormal findings: Secondary | ICD-10-CM | POA: Diagnosis not present

## 2020-07-01 DIAGNOSIS — Z1151 Encounter for screening for human papillomavirus (HPV): Secondary | ICD-10-CM | POA: Diagnosis not present

## 2020-07-01 LAB — HM PAP SMEAR

## 2020-07-21 ENCOUNTER — Other Ambulatory Visit: Payer: Self-pay | Admitting: Family Medicine

## 2020-07-21 DIAGNOSIS — M62838 Other muscle spasm: Secondary | ICD-10-CM

## 2020-07-21 NOTE — Telephone Encounter (Signed)
I reviewed patient's records from the PMP aware controlled substance registry today.   

## 2020-07-22 ENCOUNTER — Encounter: Payer: Self-pay | Admitting: Family Medicine

## 2020-07-22 DIAGNOSIS — N6001 Solitary cyst of right breast: Secondary | ICD-10-CM | POA: Diagnosis not present

## 2020-07-22 DIAGNOSIS — C50919 Malignant neoplasm of unspecified site of unspecified female breast: Secondary | ICD-10-CM | POA: Diagnosis not present

## 2020-07-22 DIAGNOSIS — Z9889 Other specified postprocedural states: Secondary | ICD-10-CM | POA: Diagnosis not present

## 2020-07-22 DIAGNOSIS — Z170 Estrogen receptor positive status [ER+]: Secondary | ICD-10-CM | POA: Diagnosis not present

## 2020-07-22 DIAGNOSIS — R922 Inconclusive mammogram: Secondary | ICD-10-CM | POA: Diagnosis not present

## 2020-07-23 ENCOUNTER — Telehealth: Payer: Self-pay

## 2020-07-23 NOTE — Telephone Encounter (Signed)
[facility] stated the reason why the prescription was filled was due to a early request and insurance not covering it. Per Dr. Jonni Sanger it is ok to refill medication early. Spoke with the patient and made her aware that her medication will be filled but insurance will not cover it but [facility] will [facility] comp card. She was very appreciative for the call. No other questions or concerns at this time.

## 2020-07-23 NOTE — Telephone Encounter (Signed)
Please call pt once sent in

## 2020-07-23 NOTE — Telephone Encounter (Signed)
.   LAST APPOINTMENT DATE: 07/21/2020   NEXT APPOINTMENT DATE:@11 /08/2020  MEDICATION:diazepam (VALIUM) 10 MG tablet    [id number] [place] [place] - [address]. AT [place]  *   [facility] and is EXTREMELY anxious waiting for results in regards to her breast cancer

## 2020-08-02 DIAGNOSIS — G8929 Other chronic pain: Secondary | ICD-10-CM | POA: Diagnosis not present

## 2020-08-02 DIAGNOSIS — M25572 Pain in left ankle and joints of left foot: Secondary | ICD-10-CM | POA: Diagnosis not present

## 2020-08-13 ENCOUNTER — Telehealth: Payer: Self-pay

## 2020-08-13 ENCOUNTER — Encounter: Payer: Self-pay | Admitting: Family Medicine

## 2020-08-13 ENCOUNTER — Other Ambulatory Visit: Payer: Self-pay | Admitting: Family Medicine

## 2020-08-13 DIAGNOSIS — F902 Attention-deficit hyperactivity disorder, combined type: Secondary | ICD-10-CM

## 2020-08-13 DIAGNOSIS — J302 Other seasonal allergic rhinitis: Secondary | ICD-10-CM

## 2020-08-13 MED ORDER — AMPHETAMINE-DEXTROAMPHETAMINE 20 MG PO TABS: 20.0000 mg | ORAL_TABLET | Freq: Two times a day (BID) | ORAL | 0 refills | Status: DC

## 2020-08-13 NOTE — Telephone Encounter (Signed)
.. °  LAST APPOINTMENT DATE: 08/13/2020   NEXT APPOINTMENT DATE:@11 /08/2020  MEDICATION:amphetamine-dextroamphetamine (ADDERALL) 20 MG tablet    [id number] [place], [place] - [address]. AT [place]

## 2020-08-13 NOTE — Telephone Encounter (Signed)
Last refill: 07/18/20 #60, 0 Last OV: 05/19/20 dx. ADHD

## 2020-08-13 NOTE — Telephone Encounter (Signed)
Refill request sent to PCP.

## 2020-08-19 NOTE — Telephone Encounter (Signed)
Patient is following up regarding medication refill

## 2020-08-20 NOTE — Telephone Encounter (Signed)
Looks like this was sent to the pharmacy on 08/13/20.

## 2020-09-06 ENCOUNTER — Encounter: Payer: Self-pay | Admitting: Family Medicine

## 2020-09-08 ENCOUNTER — Other Ambulatory Visit: Payer: Self-pay

## 2020-09-08 DIAGNOSIS — Z0182 Encounter for allergy testing: Secondary | ICD-10-CM

## 2020-09-08 NOTE — Telephone Encounter (Signed)
Please advise 

## 2020-09-08 NOTE — Telephone Encounter (Signed)
Yes

## 2020-09-08 NOTE — Telephone Encounter (Signed)
Patient called back and is interested in doing the COVID component testing now. Patient had the J&J vaccine in June with no issues at all. Patient is considering the booster shot. Patient would like to confirm PEG allergy. Please see below for history.  Please advise if COVID component testing can be scheduled.

## 2020-09-08 NOTE — Telephone Encounter (Signed)
That is fine - please schedule for component testing, next available.   Salvatore Marvel, MD [facility]

## 2020-09-09 NOTE — Telephone Encounter (Signed)
Patient scheduled component testing in [facility] on 11/18 at [place] with Dr. Maudie Mercury. Patient would like to know if she should get the J&J booster in the meantime. Patient will be on [place] store often. Also, patient is taking homeopathic's and will be sending pictures via mychart to confirm if she can take them before her appointment.  Please advise on booster.

## 2020-09-09 NOTE — Telephone Encounter (Signed)
She can get the J&J booster as she had no issues with the first dose.

## 2020-09-09 NOTE — Telephone Encounter (Signed)
Pt is aware.  

## 2020-09-21 ENCOUNTER — Encounter: Payer: Self-pay | Admitting: Family Medicine

## 2020-09-22 ENCOUNTER — Encounter: Payer: Self-pay | Admitting: Family Medicine

## 2020-09-22 ENCOUNTER — Other Ambulatory Visit: Payer: Self-pay

## 2020-09-22 ENCOUNTER — Ambulatory Visit (INDEPENDENT_AMBULATORY_CARE_PROVIDER_SITE_OTHER): Payer: BC Managed Care – PPO | Admitting: Family Medicine

## 2020-09-22 VITALS — BP 128/88 | HR 90 | Temp 97.9°F | Ht 69.0 in | Wt 153.4 lb

## 2020-09-22 DIAGNOSIS — Z79899 Other long term (current) drug therapy: Secondary | ICD-10-CM

## 2020-09-22 DIAGNOSIS — Z1159 Encounter for screening for other viral diseases: Secondary | ICD-10-CM

## 2020-09-22 DIAGNOSIS — M62838 Other muscle spasm: Secondary | ICD-10-CM | POA: Diagnosis not present

## 2020-09-22 DIAGNOSIS — Z0000 Encounter for general adult medical examination without abnormal findings: Secondary | ICD-10-CM

## 2020-09-22 DIAGNOSIS — F902 Attention-deficit hyperactivity disorder, combined type: Secondary | ICD-10-CM

## 2020-09-22 DIAGNOSIS — F339 Major depressive disorder, recurrent, unspecified: Secondary | ICD-10-CM

## 2020-09-22 MED ORDER — AMPHETAMINE-DEXTROAMPHETAMINE 20 MG PO TABS: 20.0000 mg | ORAL_TABLET | Freq: Two times a day (BID) | ORAL | 0 refills | Status: DC

## 2020-09-22 NOTE — Patient Instructions (Signed)
Please return in 6 months for ADD recheck.   I have sent in 6 months of adderall RXs for you to your pharmacy.   I will release your lab results to you on your MyChart account with further instructions. Please reply with any questions.    If you have any questions or concerns, please don't hesitate to send me a message via MyChart or call the office at [phone]. Thank you for visiting with Natasha Mcclain today! It's our pleasure caring for you.

## 2020-09-22 NOTE — Progress Notes (Signed)
Subjective  Chief Complaint  Patient presents with  . Annual Exam    fasting   . Ankle Pain    patient states that shes having ongoing issues with her left ankle thats been going on since 03/07/2019    HPI: Natasha Mcclain is a 54 y.o. female who presents to [facility] today for a Female Wellness Visit. She also has the concerns and/or needs as listed above in the chief complaint. These will be addressed in addition to the Health Maintenance Visit.   Wellness Visit: annual visit with health maintenance review and exam without Pap   HM: had mammo and pap with gyn onc. Reports normal. Fasting. Overall doing well Chronic disease f/u and/or acute problem visit: (deemed necessary to be done in addition to the wellness visit):  ADD: stable on meds. No concerns or side effects  Chronic ankle pain; slowly improving. Reviewed ortho notes  Reports mm spasms are controlled on chronic benzo.   Depression is stable.   Assessment  1. Annual physical exam   2. Attention deficit hyperactivity disorder (ADHD), combined type   3. Chronic prescription benzodiazepine use   4. Major depression, recurrent, chronic ([place])   5. Muscle spasms of neck   6. Need for hepatitis C screening test      Plan  Female Wellness Visit:  Age appropriate Health Maintenance and Prevention measures were discussed with patient. Included topics are cancer screening recommendations, ways to keep healthy (see AVS) including dietary and exercise recommendations, regular eye and dental care, use of seat belts, and avoidance of moderate alcohol use and tobacco use.   BMI: discussed patient's BMI and encouraged positive lifestyle modifications to help get to or maintain a target BMI.  HM needs and immunizations were addressed and ordered. See below for orders. See HM and immunization section for updates.  Routine labs and screening tests ordered including cmp, cbc and lipids where  appropriate.  Discussed recommendations regarding Vit D and calcium supplementation (see AVS)  Chronic disease management visit and/or acute problem visit:  ADD: well controlled. Refilled meds x 6 mo. I reviewed patient's records from the PMP aware controlled substance registry today.   Mood is controlled. Continue effexor 75 once a day.  Ankle pain and mm spasm: gabapetin and benzo.    Follow up: 6 mo for ADD  Orders Placed This Encounter  Procedures  . COMPLETE METABOLIC PANEL WITH GFR  . Hepatitis C antibody  . CBC with Differential/Platelet  . Lipid panel  . TSH   Meds ordered this encounter  Medications  . amphetamine-dextroamphetamine (ADDERALL) 20 MG tablet    Sig: Take 1 tablet (20 mg total) by mouth 2 (two) times daily.    Dispense:  60 tablet    Refill:  0  . amphetamine-dextroamphetamine (ADDERALL) 20 MG tablet    Sig: Take 1 tablet (20 mg total) by mouth 2 (two) times daily.    Dispense:  60 tablet    Refill:  0    Please keep on file for next refill due december  . amphetamine-dextroamphetamine (ADDERALL) 20 MG tablet    Sig: Take 1 tablet (20 mg total) by mouth 2 (two) times daily.    Dispense:  60 tablet    Refill:  0    Please keep on file for next refill due January  . amphetamine-dextroamphetamine (ADDERALL) 20 MG tablet    Sig: Take 1 tablet (20 mg total) by mouth 2 (two) times daily.  Dispense:  60 tablet    Refill:  0    Please keep on file for next refill due february  . amphetamine-dextroamphetamine (ADDERALL) 20 MG tablet    Sig: Take 1 tablet (20 mg total) by mouth 2 (two) times daily.    Dispense:  60 tablet    Refill:  0    Please keep on file for next refill due March  . amphetamine-dextroamphetamine (ADDERALL) 20 MG tablet    Sig: Take 1 tablet (20 mg total) by mouth 2 (two) times daily.    Dispense:  60 tablet    Refill:  0    Please keep on file for next refill due april      Lifestyle: Body mass index is 22.65 kg/m. Wt  Readings from Last 3 Encounters:  09/22/20 153 lb 6.4 oz (69.6 kg)  05/19/20 157 lb 12.8 oz (71.6 kg)  12/29/19 165 lb (74.8 kg)    Patient Active Problem List   Diagnosis Date Noted  . Chronic prescription benzodiazepine use 12/29/2019    Priority: High    Prescribed by prior pcp for neck muscle spasma   . Attention deficit hyperactivity disorder (ADHD), combined type 01/13/2019    Priority: High  . History of left breast cancer 2009 08/13/2013    Priority: High    2009, mastectomy left, chemo and radiation. [facility] manages.  Negative BRCA testing; mother passed from breast cancer Sister with breast cancer 70   . Major depression, recurrent, chronic ([place]) 07/06/2011    Priority: High    Started with dx of breast cancer   . Family history of colon cancer 12/29/2019    Priority: Medium    1st cousin father's side, 48, female 1st cousin father's side, 44s, female   . Muscle spasms of neck 01/13/2019    Priority: Medium  . Allergic conjunctivitis 12/29/2019    Priority: Low  . Seasonal allergies 01/13/2019    Priority: Low  . Ovarian cyst 01/06/2011    Priority: Low  . Perimenopausal 12/29/2019   Health Maintenance  Topic Date Due  . Hepatitis C Screening  Never done  . INFLUENZA VACCINE  06/13/2020  . MAMMOGRAM  05/28/2021  . PAP SMEAR-Modifier  07/02/2023  . TETANUS/TDAP  10/01/2028  . COLONOSCOPY  01/19/2029  . COVID-19 Vaccine  Completed  . HIV Screening  Completed   Immunization History  Administered Date(s) Administered  . Influenza Whole 07/30/2019  . Influenza, Seasonal, Injecte, Preservative Fre 08/30/2008  . Influenza,inj,Quad PF,6+ Mos 10/01/2018, 07/30/2019  . Janssen (J&J) SARS-COV-2 Vaccination 05/10/2020  . Tdap 10/01/2018   We updated and reviewed the patient's past history in detail and it is documented below. Allergies: Patient is allergic to paclitaxel, other, short ragweed pollen ext, tramadol, and tape. Past Medical History Patient  has a past  medical history of Allergic conjunctivitis (12/29/2019), Allergy, Arthritis, Breast cancer ([place]), Depression, Family history of colon cancer (12/29/2019), and Genital warts. Past Surgical History Patient  has a past surgical history that includes Breast biopsy (2009); Mastectomy (Left); Breast reconstruction (Left, 2010); and breast reduction on right. Family History: Patient family history includes Breast cancer in her mother; Cancer in her sister; Colon cancer in her cousin; Diabetes in her brother, mother, and sister; Early death in her father and mother; Hypertension in her sister; Miscarriages / Stillbirths in her mother. Social History:  Patient  reports that she has never smoked. She has never used smokeless tobacco. She reports current alcohol use. She reports previous drug use.  Review of Systems: Constitutional: negative for fever or malaise Ophthalmic: negative for photophobia, double vision or loss of vision Cardiovascular: negative for chest pain, dyspnea on exertion, or new LE swelling Respiratory: negative for SOB or persistent cough Gastrointestinal: negative for abdominal pain, change in bowel habits or melena Genitourinary: negative for dysuria or gross hematuria, no abnormal uterine bleeding or disharge Musculoskeletal: negative for new gait disturbance or muscular weakness Integumentary: negative for new or persistent rashes, no breast lumps Neurological: negative for TIA or stroke symptoms Psychiatric: negative for SI or delusions Allergic/Immunologic: negative for hives  Patient Care Team    Relationship Specialty Notifications Start End  [name], MD PCP - General Family Medicine  12/29/19     Objective  Vitals: BP 128/88   Pulse 90   Temp 97.9 F (36.6 C) (Temporal)   Ht 5' 9"  (1.753 m)   Wt 153 lb 6.4 oz (69.6 kg)   SpO2 99%   BMI 22.65 kg/m  General:  Well developed, well nourished, no acute distress  Psych:  Alert and orientedx3,normal mood and  affect HEENT:  Normocephalic, atraumatic, non-icteric sclera,  supple neck without adenopathy, mass or thyromegaly Cardiovascular:  Normal S1, S2, RRR without gallop, rub or murmur Respiratory:  Good breath sounds bilaterally, CTAB with normal respiratory effort Gastrointestinal: normal bowel sounds, soft, non-tender, no noted masses. No HSM MSK: no deformities, contusions. Joints are without erythema or swelling.  Skin:  Warm, no rashes or suspicious lesions noted Neurologic:    Mental status is normal. CN 2-11 are normal. Gross motor and sensory exams are normal. Normal gait. No tremor    Commons side effects, risks, benefits, and alternatives for medications and treatment plan prescribed today were discussed, and the patient expressed understanding of the given instructions. Patient is instructed to call or message via MyChart if he/she has any questions or concerns regarding our treatment plan. No barriers to understanding were identified. We discussed Red Flag symptoms and signs in detail. Patient expressed understanding regarding what to do in case of urgent or emergency type symptoms.   Medication list was reconciled, printed and provided to the patient in AVS. Patient instructions and summary information was reviewed with the patient as documented in the AVS. This note was prepared with assistance of Dragon voice recognition software. Occasional wrong-word or sound-a-like substitutions may have occurred due to the inherent limitations of voice recognition software  This visit occurred during the SARS-CoV-2 public health emergency.  Safety protocols were in place, including screening questions prior to the visit, additional usage of staff PPE, and extensive cleaning of exam room while observing appropriate contact time as indicated for disinfecting solutions.

## 2020-09-30 ENCOUNTER — Encounter: Payer: BC Managed Care – PPO | Admitting: Allergy

## 2020-09-30 LAB — COMPLETE METABOLIC PANEL WITH GFR
AG Ratio: 1.7 (calc) (ref 1.0–2.5)
ALT: 24 U/L (ref 6–29)
AST: 18 U/L (ref 10–35)
Albumin: 4.7 g/dL (ref 3.6–5.1)
Alkaline phosphatase (APISO): 122 U/L (ref 37–153)
BUN: 10 mg/dL (ref 7–25)
CO2: 29 mmol/L (ref 20–32)
Calcium: 10.7 mg/dL — ABNORMAL HIGH (ref 8.6–10.4)
Chloride: 99 mmol/L (ref 98–110)
Creat: 0.66 mg/dL (ref 0.50–1.05)
GFR, Est African American: 118 mL/min/{1.73_m2} (ref >=60)
GFR, Est Non African American: 102 mL/min/{1.73_m2} (ref >=60)
Globulin: 2.8 g/dL (calc) (ref 1.9–3.7)
Glucose, Bld: 101 mg/dL — ABNORMAL HIGH (ref 65–99)
Potassium: 4.1 mmol/L (ref 3.5–5.3)
Sodium: 135 mmol/L (ref 135–146)
Total Bilirubin: 0.6 mg/dL (ref 0.2–1.2)
Total Protein: 7.5 g/dL (ref 6.1–8.1)

## 2020-09-30 LAB — LIPID PANEL
Cholesterol: 227 mg/dL — ABNORMAL HIGH (ref <200)
HDL: 77 mg/dL (ref >=50)
LDL Cholesterol (Calc): 133 mg/dL (calc) — ABNORMAL HIGH
Non-HDL Cholesterol (Calc): 150 mg/dL (calc) — ABNORMAL HIGH (ref <130)
Total CHOL/HDL Ratio: 2.9 (calc) (ref <5.0)
Triglycerides: 76 mg/dL (ref <150)

## 2020-09-30 LAB — TEST AUTHORIZATION

## 2020-09-30 LAB — TSH: TSH: 0.98 mIU/L

## 2020-09-30 LAB — HEPATITIS C ANTIBODY
Hepatitis C Ab: NONREACTIVE
SIGNAL TO CUT-OFF: 0.01 (ref <1.00)

## 2020-09-30 LAB — EXTRA LAV TOP TUBE

## 2020-10-14 ENCOUNTER — Encounter: Payer: BC Managed Care – PPO | Admitting: Allergy

## 2020-10-16 IMAGING — DX LEFT FOOT - COMPLETE 3+ VIEW
3 series · 3 of 3 positions shown · non-contrast
Comparison: Ankle radiograph 03/07/2019

CLINICAL DATA: Foot pain

EXAM:
LEFT FOOT - COMPLETE 3+ VIEW

[foot dp]
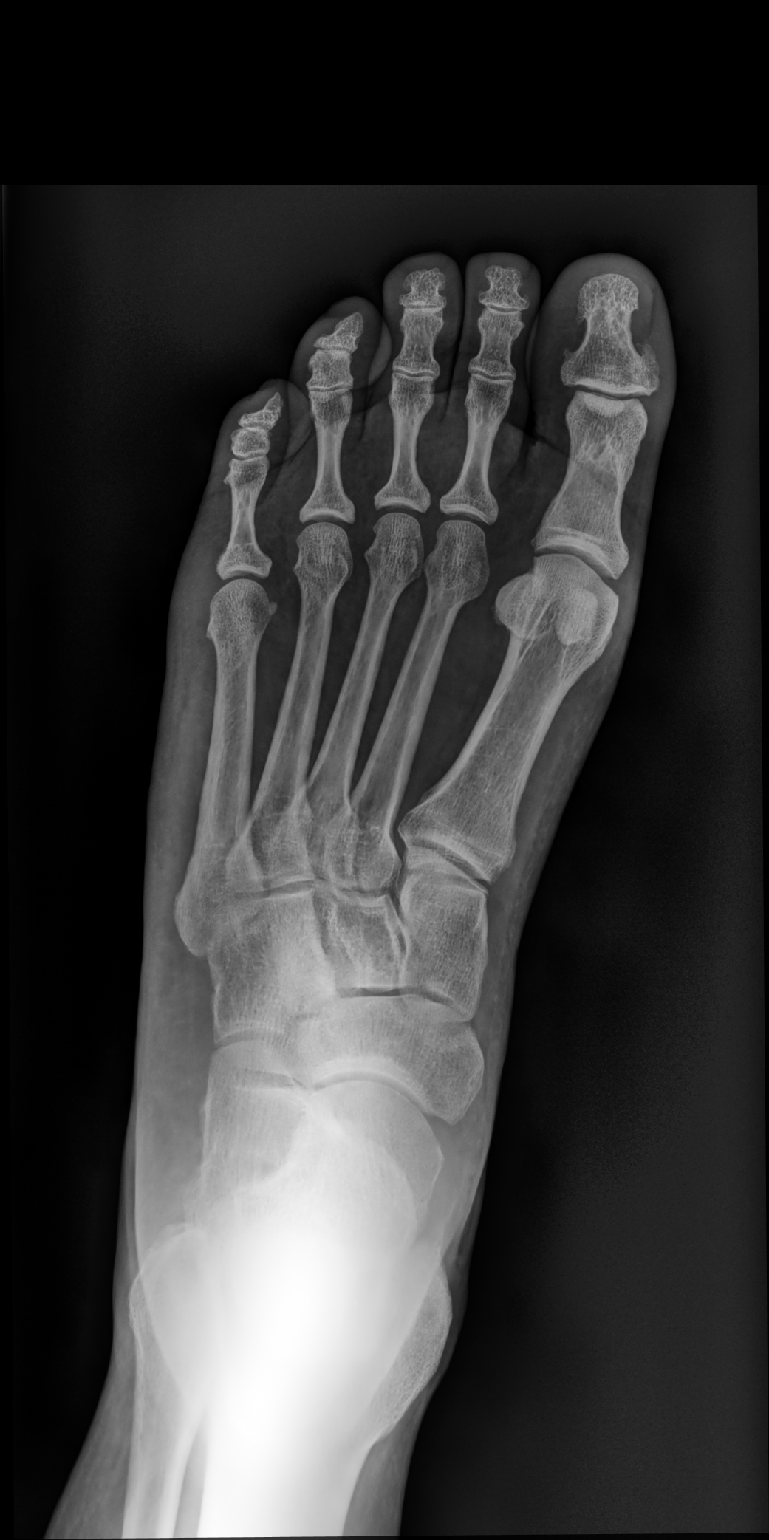

[foot oblique]
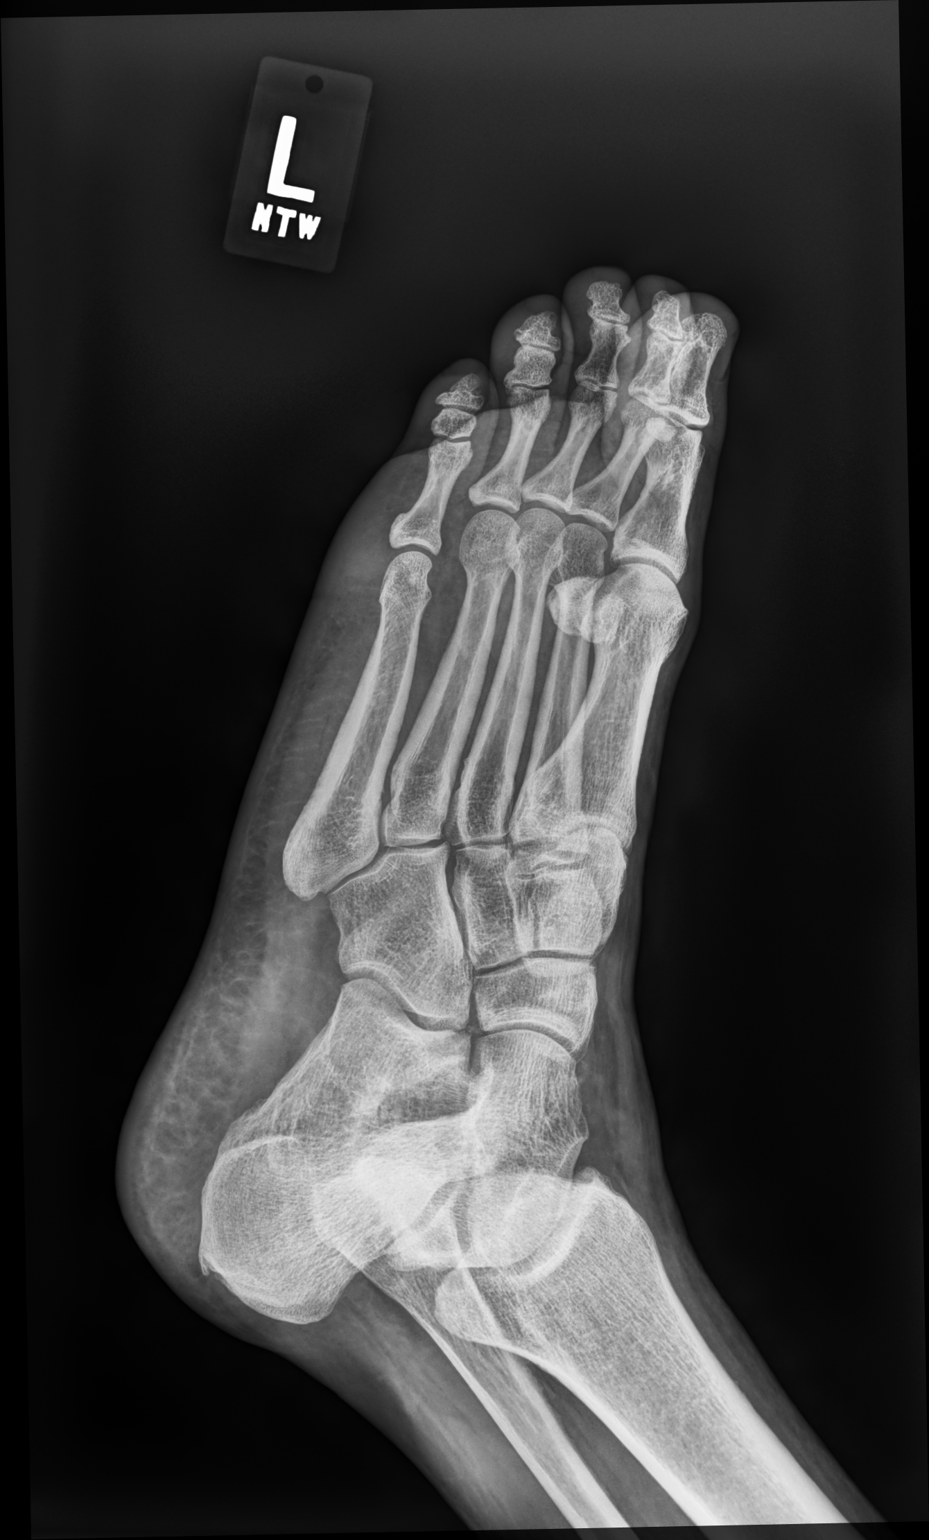

[foot lat]
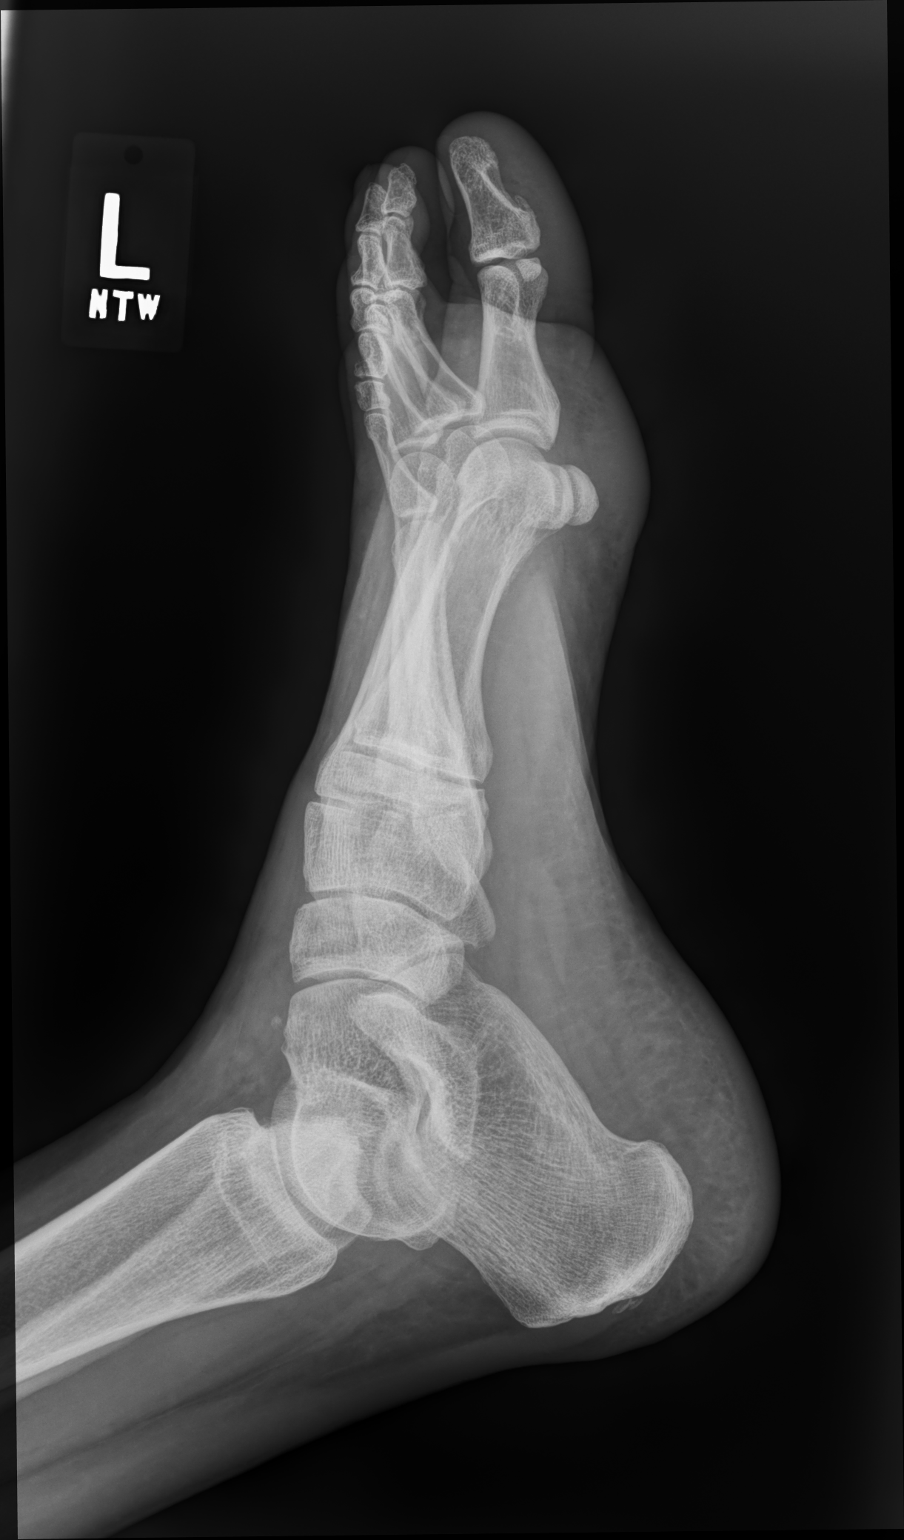

[3 of 3 positions shown; findings below may reference images not displayed]

FINDINGS: No acute displaced fracture or malalignment. Calcification along the
dorsal aspect of the talus could reflect remote injury. Joint spaces
are maintained.
IMPRESSION: Negative.

## 2020-10-19 ENCOUNTER — Encounter: Payer: Self-pay | Admitting: Family Medicine

## 2020-10-19 DIAGNOSIS — Z20822 Contact with and (suspected) exposure to covid-19: Secondary | ICD-10-CM | POA: Diagnosis not present

## 2020-10-22 ENCOUNTER — Encounter: Payer: Self-pay | Admitting: Family Medicine

## 2020-10-22 ENCOUNTER — Other Ambulatory Visit: Payer: Self-pay

## 2020-10-22 ENCOUNTER — Telehealth (INDEPENDENT_AMBULATORY_CARE_PROVIDER_SITE_OTHER): Payer: BC Managed Care – PPO | Admitting: Family Medicine

## 2020-10-22 DIAGNOSIS — J302 Other seasonal allergic rhinitis: Secondary | ICD-10-CM | POA: Diagnosis not present

## 2020-10-22 DIAGNOSIS — J0100 Acute maxillary sinusitis, unspecified: Secondary | ICD-10-CM | POA: Diagnosis not present

## 2020-10-22 MED ORDER — FLUTICASONE PROPIONATE 50 MCG/ACT NA SUSP: 1.0000 | Freq: Every day | NASAL | 6 refills | Status: DC

## 2020-10-22 MED ORDER — AMOXICILLIN-POT CLAVULANATE 875-125 MG PO TABS: 1.0000 | ORAL_TABLET | Freq: Two times a day (BID) | ORAL | 0 refills | Status: DC

## 2020-10-22 NOTE — Progress Notes (Signed)
Virtual Visit via Video Note  Subjective  CC:  Chief Complaint  Patient presents with  . flu symptoms    Started experiencing symptoms the week after Thanksgiving. Tested negative for COVID on 10/21/20. Cough, nasal congestion, ear aches, and body aches     I connected with NAVAYA WIATREK on 10/22/20 at  9:30 AM EST by a video enabled telemedicine application and verified that I am speaking with the correct person using two identifiers. Location patient: Home Location provider: [facility], Office Persons participating in the virtual visit: Natasha Mcclain, [name], MD Reymundo Poll CMA  I discussed the limitations of evaluation and management by telemedicine and the availability of in person appointments. The patient expressed understanding and agreed to proceed. HPI: Natasha Mcclain is a 54 y.o. female who was contacted today to address the problems listed above in the chief complaint. . Allergy sxs x 10 days but now with dental a facial pain; thick drainage w/o fevers. covid negative tested. Vaccinated. No sob or cough.    Assessment  1. Acute non-recurrent maxillary sinusitis   2. Seasonal allergies      Plan   sinusitis:  augmentin x 7 days and flonase. Oral antihistamines w/o decongestants to avoid palpitations. Hydration and rest.  I discussed the assessment and treatment plan with the patient. The patient was provided an opportunity to ask questions and all were answered. The patient agreed with the plan and demonstrated an understanding of the instructions.   The patient was advised to call back or seek an in-person evaluation if the symptoms worsen or if the condition fails to improve as anticipated. Follow up: No follow-ups on file.  Visit date not found  Meds ordered this encounter  Medications  . amoxicillin-clavulanate (AUGMENTIN) 875-125 MG tablet    Sig: Take 1 tablet by mouth 2 (two) times daily.    Dispense:  14 tablet     Refill:  0  . fluticasone (FLONASE) 50 MCG/ACT nasal spray    Sig: Place 1 spray into both nostrils daily.    Dispense:  16 g    Refill:  6      I reviewed the patients updated PMH, FH, and SocHx.    Patient Active Problem List   Diagnosis Date Noted  . Chronic prescription benzodiazepine use 12/29/2019    Priority: High  . Attention deficit hyperactivity disorder (ADHD), combined type 01/13/2019    Priority: High  . History of left breast cancer 2009 08/13/2013    Priority: High  . Major depression, recurrent, chronic ([place]) 07/06/2011    Priority: High  . Family history of colon cancer 12/29/2019    Priority: Medium  . Muscle spasms of neck 01/13/2019    Priority: Medium  . Allergic conjunctivitis 12/29/2019    Priority: Low  . Seasonal allergies 01/13/2019    Priority: Low  . Ovarian cyst 01/06/2011    Priority: Low  . Perimenopausal 12/29/2019   Current Meds  Medication Sig  . amphetamine-dextroamphetamine (ADDERALL) 20 MG tablet Take 1 tablet (20 mg total) by mouth 2 (two) times daily.  Marland Kitchen amphetamine-dextroamphetamine (ADDERALL) 20 MG tablet Take 1 tablet (20 mg total) by mouth 2 (two) times daily.  Derrill Memo ON 11/21/2020] amphetamine-dextroamphetamine (ADDERALL) 20 MG tablet Take 1 tablet (20 mg total) by mouth 2 (two) times daily.  Derrill Memo ON 12/21/2020] amphetamine-dextroamphetamine (ADDERALL) 20 MG tablet Take 1 tablet (20 mg total) by mouth 2 (  two) times daily.  Derrill Memo ON 01/20/2021] amphetamine-dextroamphetamine (ADDERALL) 20 MG tablet Take 1 tablet (20 mg total) by mouth 2 (two) times daily.  Derrill Memo ON 02/19/2021] amphetamine-dextroamphetamine (ADDERALL) 20 MG tablet Take 1 tablet (20 mg total) by mouth 2 (two) times daily.  . diazepam (VALIUM) 10 MG tablet Take 1 tablet (10 mg total) by mouth at bedtime as needed (muscle spasm).  Marland Kitchen gabapentin (NEURONTIN) 100 MG capsule TAKE 1-3 CAPSULES BY MOUTH AT BEDTIME  . Olopatadine HCl 0.2 % SOLN ADMINISTER 1 DROP TO BOTH  EYES TWO TIMES A DAY  . venlafaxine (EFFEXOR) 75 MG tablet Take 1 tablet (75 mg total) by mouth daily.    Allergies: Patient is allergic to paclitaxel, other, short ragweed pollen ext, tramadol, and tape. Family History: Patient family history includes Breast cancer in her mother; Cancer in her sister; Colon cancer in her cousin; Diabetes in her brother, mother, and sister; Early death in her father and mother; Hypertension in her sister; Miscarriages / Stillbirths in her mother. Social History:  Patient  reports that she has never smoked. She has never used smokeless tobacco. She reports current alcohol use. She reports previous drug use.  Review of Systems: Constitutional: Negative for fever malaise or anorexia Cardiovascular: negative for chest pain Respiratory: negative for SOB or persistent cough Gastrointestinal: negative for abdominal pain  OBJECTIVE Vitals: There were no vitals taken for this visit. General: no acute distress , A&Ox3  [name], MD

## 2020-10-25 ENCOUNTER — Encounter: Payer: Self-pay | Admitting: Family Medicine

## 2020-10-26 MED ORDER — GUAIFENESIN-CODEINE 100-10 MG/5ML PO SOLN: 5.0000 mL | Freq: Four times a day (QID) | ORAL | 0 refills | Status: DC | PRN

## 2020-10-27 DIAGNOSIS — Z20822 Contact with and (suspected) exposure to covid-19: Secondary | ICD-10-CM | POA: Diagnosis not present

## 2020-10-29 ENCOUNTER — Telehealth: Payer: Self-pay

## 2020-10-29 NOTE — Telephone Encounter (Signed)
FYI

## 2020-10-29 NOTE — Telephone Encounter (Signed)
Patient states she is not improving from last week she is still have body aches, runny nose, clammy, muscle cramping, draining,and cough   Patient did finish her last dose of her antibiotics this morning. Patient would like maybe different antibiotics,or some other medication to help her get over this.  She got her negative covid results yesterday.

## 2020-10-29 NOTE — Telephone Encounter (Signed)
Rec in person ov early next week or Saturday clinic eval tomorrow for recheck.

## 2020-10-29 NOTE — Telephone Encounter (Signed)
Please advise 

## 2020-10-30 ENCOUNTER — Telehealth (INDEPENDENT_AMBULATORY_CARE_PROVIDER_SITE_OTHER): Payer: BC Managed Care – PPO | Admitting: Family Medicine

## 2020-10-30 ENCOUNTER — Encounter: Payer: Self-pay | Admitting: Family Medicine

## 2020-10-30 DIAGNOSIS — J302 Other seasonal allergic rhinitis: Secondary | ICD-10-CM

## 2020-10-30 DIAGNOSIS — J989 Respiratory disorder, unspecified: Secondary | ICD-10-CM | POA: Diagnosis not present

## 2020-10-30 DIAGNOSIS — J069 Acute upper respiratory infection, unspecified: Secondary | ICD-10-CM | POA: Diagnosis not present

## 2020-10-30 MED ORDER — ALBUTEROL SULFATE HFA 108 (90 BASE) MCG/ACT IN AERS: 2.0000 | INHALATION_SPRAY | Freq: Four times a day (QID) | RESPIRATORY_TRACT | 0 refills | Status: DC | PRN

## 2020-10-30 MED ORDER — PREDNISONE 20 MG PO TABS
40.0000 mg | ORAL_TABLET | Freq: Every day | ORAL | 0 refills | Status: AC
Start: 2020-10-30 — End: 2020-11-04
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
## 2020-11-02 ENCOUNTER — Encounter: Payer: BC Managed Care – PPO | Admitting: Allergy

## 2020-11-21 ENCOUNTER — Other Ambulatory Visit: Payer: Self-pay | Admitting: Family Medicine

## 2020-11-21 DIAGNOSIS — J989 Respiratory disorder, unspecified: Secondary | ICD-10-CM

## 2020-12-02 ENCOUNTER — Encounter: Payer: BC Managed Care – PPO | Admitting: Allergy

## 2020-12-14 DIAGNOSIS — Z20828 Contact with and (suspected) exposure to other viral communicable diseases: Secondary | ICD-10-CM | POA: Diagnosis not present

## 2020-12-14 DIAGNOSIS — Z20822 Contact with and (suspected) exposure to covid-19: Secondary | ICD-10-CM | POA: Diagnosis not present

## 2020-12-15 ENCOUNTER — Encounter: Payer: Self-pay | Admitting: Family Medicine

## 2020-12-16 ENCOUNTER — Other Ambulatory Visit: Payer: Self-pay

## 2020-12-16 ENCOUNTER — Encounter: Payer: Self-pay | Admitting: Allergy

## 2020-12-16 ENCOUNTER — Encounter: Payer: Self-pay | Admitting: Family Medicine

## 2020-12-16 ENCOUNTER — Ambulatory Visit: Payer: BC Managed Care – PPO | Admitting: Allergy

## 2020-12-16 VITALS — BP 112/72 | HR 77 | Temp 96.4°F | Resp 18 | Ht 68.9 in | Wt 163.8 lb

## 2020-12-16 DIAGNOSIS — T50905D Adverse effect of unspecified drugs, medicaments and biological substances, subsequent encounter: Secondary | ICD-10-CM

## 2020-12-16 DIAGNOSIS — R070 Pain in throat: Secondary | ICD-10-CM

## 2020-12-16 DIAGNOSIS — Z888 Allergy status to other drugs, medicaments and biological substances status: Secondary | ICD-10-CM

## 2020-12-16 DIAGNOSIS — T50905A Adverse effect of unspecified drugs, medicaments and biological substances, initial encounter: Secondary | ICD-10-CM

## 2020-12-16 NOTE — Progress Notes (Signed)
New Patient Note  RE: NYKERA RUNKEL MRN: [id number] DOB: May 24, 1966 Date of Office Visit: 12/16/2020  Referring provider: [name], MD Primary care provider: [name], MD  Chief Complaint: Food/Drug Challenge  History of Present Illness: I had the pleasure of seeing Natasha Mcclain for initial evaluation at the [facility] on 12/17/2020. She is a 55 y.o. female, who is referred here by [name], MD for the evaluation of drug allergies.  Patient would like to be tested for the mRNA vaccine to see if she is allergic to it or not.  She was told by the cancer pharmacy that she had a reaction a pegylated product (Paclitaxel) and she needs to avoid the mRNA vaccine.   She had 2 J&J vaccines with no issues.  No prior Covid-19 infection.  Patient is a breast cancer survivor and is very interested in getting either Pfizer or Moderna vaccine to get better protection against COVID-19.   Any known reactions to polyethylene glycol or polysorbate?  Patient had reaction to Paclitaxel which is an IV chemo medication for breast cancer in Nov 2009. Patient had throat closure within 1 minute of receiving the medication. This was the first time she had exposure to this drug.  She received some type of IV medications which helped - unsure exactly what drugs were given. She does not recall getting IM epinephrine for this.  Unfortunately unable to review records of this event. She then was switched to Abraxane which contains paclitaxel without the polyoxyl 35 product and had no issues.   Any history of anaphylaxis to vaccinations, including a COVID19 vaccine?  In the past - patient received 3 vaccines at the same time (flu, TdaP) and woke up the following day with palpitations for hours.  Previously tolerated these vaccinations without any issues. Since then she had the flu shot without any issues.   Any history of reactions to injectable medications? No.  Any history  of anaphylaxis to colonoscopy preps (i.e.Miralax)? Patient had colonoscopy and Miralax in the past without any issues. No recent Miralax exposure.   Any history of dermal filler treatments in the last year? No.  What are the ingredients in paclitaxel? Active ingredient: paclitaxel. Inactive ingredients include: Polyoxyl 35 castor oil, NF and dehydrated alcohol, USP and Citric Acid, USP.  What are the ingredients in ABRAXANE? Active ingredient: paclitaxel (bound to human albumin). Other ingredient: human albumin (containing sodium caprylate and sodium acetyltryptophanate).   Assessment and Plan: Natasha Mcclain is a 55 y.o. female with: Drug reaction Patient had reaction in the form of throat closure/tightness within 1 minutes of receiving IV Paclitaxel for the first time in 2009. Treated with unknown IV medications and does not recall receiving IM epinephrine. Symptoms resolved after a few hours. Tolerated Abraxane which contains paclitaxel without the polyoxyl 35 product with no issues. Very interested in getting either Pfizer or Moderna mRNA Covid-19 vaccine. Received 2 J&J vaccines to date with no issues and no prior known Covid-19 infections. No other reactions to other pegylated products/drugs.  Today's skin prick and intradermal testing was negative to Miralax (source of PEG 3350), methylprednisolone acetate (source of PEG 3350), and triamcinolone acetonide (source of polysorbate-80).  All was done on right arm as patient prefers not to use left arm due to lack of lymph nodes after breast cancer surgery.   Patient tolerated oral Miralax drug challenge without any issues.  Discussed with patient at length that there are no current recommendations  regarding what to do after 2 J&J vaccines however, the mRNA based vaccines seem to offer a better protection against the current Covid-19 variants circulating in the population.   Discussed the risks and benefits and patient is very interested in  receiving an mRNA based vaccine due to her medical history.    Return for vaccine skin testing and graded challenge at [place] on 12/23/2020.  Skin prick test to Moderna and Pfizer full strength, then intradermal testing to 1:100 dilution and 1:10 dilution.   Will decide whether to give [place] or [place] given testing results in a graded vaccine challenge.  Return in about 1 week (around 12/23/2020) for Drug challenge. Other allergy screening: Asthma: no Rhino conjunctivitis: yes  Perennial rhino conjunctivitis symptoms and takes OTC antihistamines with some benefit.   Food allergy: no Hymenoptera allergy: no Urticaria: sometimes  Eczema:yes History of recurrent infections suggestive of immunodeficency: no  Diagnostics: Results discussed with patient/family.  COVID Vaccine Testing - 12/16/20 1414      Test Information   Consent Yes    Medications Triamcinolone    Triamcinolone Lot # [id number]    Triamcinolone EXP DATE 11/13/21      Pre Test Vitals   BP 112/72    Pulse 77    Resp 18      SKIN PRICK TESTING - Arm #1   Location Right Arm    Select Select      HISTAMINE (1mg /mL) Skin Prick Arm #1   Histamine Time Testing Placed 0955    Histamine Wheal 2+      Control (negative - HSA) Skin Prick Arm #1   Control Time Testing Placed 0955    Control Wheal Negative      Triamcinolone (40mg /mL) Skin Prick Arm #1   Triamcinolone Time Testing Placed 1417    Triamcinolone Wheal Negative      Methylprednisolone (40mg /mL) Skin Prick Arm #1   Methylprednisolone Time Testing Placed 0955    Methylprednisolone Wheal Negative      Miralax (1:100 or 1.7 mg/mL) Skin Prick Arm #1   Miralax Time Testing Placed 0955    Miralax Wheal Negative      Miralax (1:10 or 17mg /mL) Skin Prick Arm #1   Miralax Time Testing Placed 1015    Miralax Wheal Negative      Miralax (1:1 or 170mg /mL) Skin Prick Arm #1   Miralax Time Testing Placed 1050    Miralax Wheal Negative      INTRADERMAL  TESTING - Arm #2   Location Right Arm    Select Select      Control (negative - HSA) Intradermal Arm #2   Control Time Testing Placed  1015    Control Wheal Negative      Triamcinolone (1:100) Intradermal Arm #2   Triamcinolone Time Testing Placed 1015    Triamcinolone Wheal Negative      Methylprednisolone (1:100) Intradermal Arm #2   Methylprednisolone Time Testing Placed  1015    Methylprednisolone Wheal Negative      Triamcinolone (1:10) Intradermal Arm #2   Triamcinolone Time Testing Placed 1050    Triamcinolone Wheal Negative      Methylprednisolone (1:10) Intradermal Arm #2   Methylprednisolone Time Testing Placed  1050    Methylprednisolone Wheal Negative      Triamcinolone (1:1) Intradermal Arm #2   Triamcinolone Time Testing Placed 1110    Triamcinolone Wheal Negative      Skin Prick/Intradermal Post Testing   Skin Prick/Intradermal Testing Total Pricks 13  ORAL CHALLENGE TESTING   Select Select      Pre Challenge Vitals   BP 112/70    Pulse 70    Resp 18      Miralax 170mg /mL Suspension Oral Challenge 0.3 mL   Miralax 0.3 mL Time Given 1130      Miralax 170mg /mL Suspension Oral Challenge 3 mL   Miralax 3 mL Time Given 1148      Miralax 170mg /mL Suspension Oral Challenge 15 mL   Miralax 15 mL Time Given 1202      Post Test Vitals   BP 114/64    Pulse 68    Resp 16           Past Medical History: Patient Active Problem List   Diagnosis Date Noted  . Drug reaction 12/17/2020  . Chronic prescription benzodiazepine use 12/29/2019  . Allergic conjunctivitis 12/29/2019  . Perimenopausal 12/29/2019  . Family history of colon cancer 12/29/2019  . Attention deficit hyperactivity disorder (ADHD), combined type 01/13/2019  . Seasonal allergies 01/13/2019  . Muscle spasms of neck 01/13/2019  . History of left breast cancer 2009 08/13/2013  . Major depression, recurrent, chronic ([place]) 07/06/2011  . Ovarian cyst 01/06/2011   Past Medical  History:  Diagnosis Date  . Allergic conjunctivitis 12/29/2019  . Allergy   . Arthritis   . Breast cancer ([place])   . Depression   . Eczema   . Family history of colon cancer 12/29/2019   1st cousin father's side, 30, female 1st cousin father's side, 102s, female  . Genital warts    Past Surgical History: Past Surgical History:  Procedure Laterality Date  . BREAST BIOPSY  2009  . BREAST RECONSTRUCTION Left 2010  . breast reduction on right    . MASTECTOMY Left    Medication List:  Current Outpatient Medications  Medication Sig Dispense Refill  . albuterol (VENTOLIN HFA) 108 (90 Base) MCG/ACT inhaler TAKE 2 PUFFS BY MOUTH EVERY 6 HOURS AS NEEDED FOR WHEEZE OR SHORTNESS OF BREATH 6.7 each 0  . amphetamine-dextroamphetamine (ADDERALL) 20 MG tablet Take 1 tablet (20 mg total) by mouth 2 (two) times daily. 60 tablet 0  . Cetirizine HCl 10 MG CAPS Take 1 capsule (10 mg total) by mouth daily. 30 capsule 6  . diazepam (VALIUM) 10 MG tablet Take 1 tablet (10 mg total) by mouth at bedtime as needed (muscle spasm). 90 tablet 1  . fluticasone (FLONASE) 50 MCG/ACT nasal spray Place 1 spray into both nostrils daily. 16 g 6  . gabapentin (NEURONTIN) 100 MG capsule TAKE 1-3 CAPSULES BY MOUTH AT BEDTIME 270 capsule 3  . guaiFENesin-codeine 100-10 MG/5ML syrup Take 5 mLs by mouth every 6 (six) hours as needed for cough. 120 mL 0  . Olopatadine HCl 0.2 % SOLN ADMINISTER 1 DROP TO BOTH EYES TWO TIMES A DAY    . venlafaxine (EFFEXOR) 75 MG tablet Take 1 tablet (75 mg total) by mouth daily. 90 tablet 3  . amoxicillin-clavulanate (AUGMENTIN) 875-125 MG tablet Take 1 tablet by mouth 2 (two) times daily. (Patient not taking: Reported on 12/16/2020) 14 tablet 0  . amphetamine-dextroamphetamine (ADDERALL) 20 MG tablet Take 1 tablet (20 mg total) by mouth 2 (two) times daily. 60 tablet 0  . amphetamine-dextroamphetamine (ADDERALL) 20 MG tablet Take 1 tablet (20 mg total) by mouth 2 (two) times daily. 60 tablet 0  .  [START ON 12/21/2020] amphetamine-dextroamphetamine (ADDERALL) 20 MG tablet Take 1 tablet (20 mg total) by mouth 2 (two) times daily.  60 tablet 0  . [START ON 01/20/2021] amphetamine-dextroamphetamine (ADDERALL) 20 MG tablet Take 1 tablet (20 mg total) by mouth 2 (two) times daily. 60 tablet 0  . [START ON 02/19/2021] amphetamine-dextroamphetamine (ADDERALL) 20 MG tablet Take 1 tablet (20 mg total) by mouth 2 (two) times daily. 60 tablet 0   No current facility-administered medications for this visit.   Allergies: Allergies  Allergen Reactions  . Paclitaxel Anaphylaxis and Shortness Of Breath  . Other Other (See Comments)    Other reaction(s): Eye Redness Other reaction(s): Other (See Comments)  . Short Ragweed Pollen Ext     Other reaction(s): Eye Redness  . Tramadol Nausea And Vomiting    Nightmares and nausea and  vomiting    . Tape Rash   Social History: Social History   Socioeconomic History  . Marital status: Single    Spouse name: Not on file  . Number of children: 0  . Years of education: Not on file  . Highest education level: Not on file  Occupational History    Employer: Yoga Instructor  Tobacco Use  . Smoking status: Never Smoker  . Smokeless tobacco: Never Used  Vaping Use  . Vaping Use: Never used  Substance and Sexual Activity  . Alcohol use: Yes    Comment: Social   . Drug use: Not Currently  . Sexual activity: Not Currently  Other Topics Concern  . Not on file  Social History Narrative  . Not on file   Social Determinants of Health   Financial Resource Strain: Not on file  Food Insecurity: Not on file  Transportation Needs: Not on file  Physical Activity: Not on file  Stress: Not on file  Social Connections: Not on file   Lives in a house which is 53 years old. Smoking: denies Occupation: Dance movement psychotherapist History: Water Damage/mildew in the house: not sure Carpet in the family room: no Carpet in the bedroom: no Heating:  gas Cooling: window Pet: yes 1 dog x 9 yrs  Family History: Family History  Problem Relation Age of Onset  . Diabetes Mother   . Early death Mother   . Miscarriages / [place] Mother   . Breast cancer Mother   . Early death Father        accidental death: choking  . Hypertension Sister   . Diabetes Sister   . Diabetes Brother   . Cancer Sister   . Colon cancer Cousin   . Colon polyps Neg Hx   . Esophageal cancer Neg Hx   . Rectal cancer Neg Hx   . Stomach cancer Neg Hx    Problem                               Relation Asthma                                   No  Eczema                                No  Food allergy                          No  Allergic rhino conjunctivitis     No Drug allergy   No   Review  of Systems  Constitutional: Negative for appetite change, chills, fever and unexpected weight change.  HENT: Positive for congestion. Negative for rhinorrhea.   Eyes: Positive for itching.  Respiratory: Negative for cough, chest tightness, shortness of breath and wheezing.   Cardiovascular: Negative for chest pain.  Gastrointestinal: Negative for abdominal pain.  Genitourinary: Negative for difficulty urinating.  Skin: Negative for rash.  Neurological: Negative for headaches.   Objective: BP 112/72   Pulse 77   Temp (!) 96.4 F (35.8 C) (Temporal)   Resp 18   Ht 5' 8.9" (1.75 m)   Wt 163 lb 12.8 oz (74.3 kg)   SpO2 97%   BMI 24.26 kg/m  Body mass index is 24.26 kg/m. Physical Exam Vitals and nursing note reviewed.  Constitutional:      Appearance: Normal appearance. She is well-developed.  HENT:     Head: Normocephalic and atraumatic.     Right Ear: External ear normal.     Left Ear: External ear normal.     Nose: Nose normal.     Mouth/Throat:     Mouth: Mucous membranes are moist.     Pharynx: Oropharynx is clear.  Eyes:     Conjunctiva/sclera: Conjunctivae normal.  Cardiovascular:     Rate and Rhythm: Normal rate and regular rhythm.      Heart sounds: Normal heart sounds. No murmur heard. No friction rub. No gallop.   Pulmonary:     Effort: Pulmonary effort is normal.     Breath sounds: Normal breath sounds. No wheezing, rhonchi or rales.  Abdominal:     Palpations: Abdomen is soft.  Musculoskeletal:     Cervical back: Neck supple.  Skin:    General: Skin is warm.     Findings: No rash.  Neurological:     Mental Status: She is alert and oriented to person, place, and time.  Psychiatric:        Behavior: Behavior normal.    The plan was reviewed with the patient/family, and all questions/concerned were addressed.  It was my pleasure to see Natasha Mcclain today and participate in her care. Please feel free to contact me with any questions or concerns.  Sincerely,  Rexene Alberts, DO Allergy & Immunology  [facility] office: [place] office: [phone]

## 2020-12-16 NOTE — Patient Instructions (Addendum)
   Today's skin prick and intradermal testing was negative to Miralax (source of PEG 3350), methylprednisolone acetate (source of PEG 3350), and triamcinolone acetonide (source of polysorbate-80).  You tolerated oral Miralax drug challenge without any issues.   Return for vaccine skin testing and graded challenge at [place] on 12/23/2020.  Will decide whether to give [place] or [employer] given testing results.    Please stop antihistamines for 3 days before the visit. Hold eye drops for 1 day.

## 2020-12-17 ENCOUNTER — Encounter: Payer: Self-pay | Admitting: Allergy

## 2020-12-17 DIAGNOSIS — T50905A Adverse effect of unspecified drugs, medicaments and biological substances, initial encounter: Secondary | ICD-10-CM | POA: Insufficient documentation

## 2020-12-17 NOTE — Assessment & Plan Note (Addendum)
Patient had reaction in the form of throat closure/tightness within 1 minutes of receiving IV Paclitaxel for the first time in 2009. Treated with unknown IV medications and does not recall receiving IM epinephrine. Symptoms resolved after a few hours. Tolerated Abraxane which contains paclitaxel without the polyoxyl 35 product with no issues. Very interested in getting either Pfizer or Moderna mRNA Covid-19 vaccine. Received 2 J&J vaccines to date with no issues and no prior known Covid-19 infections. No other reactions to other pegylated products/drugs.  Today's skin prick and intradermal testing was negative to Miralax (source of PEG 3350), methylprednisolone acetate (source of PEG 3350), and triamcinolone acetonide (source of polysorbate-80).  All was done on right arm as patient prefers not to use left arm due to lack of lymph nodes after breast cancer surgery.   Patient tolerated oral Miralax drug challenge without any issues.  Discussed with patient at length that there are no current recommendations regarding what to do after 2 J&J vaccines however, the mRNA based vaccines seem to offer a better protection against the current Covid-19 variants circulating in the population.   Discussed the risks and benefits and patient is very interested in receiving an mRNA based vaccine due to her medical history.    Return for vaccine skin testing and graded challenge at [place] on 12/23/2020.  Skin prick test to Moderna and Pfizer full strength, then intradermal testing to 1:100 dilution and 1:10 dilution.   Will decide whether to give [place] or [place] given testing results in a graded vaccine challenge.

## 2020-12-23 ENCOUNTER — Ambulatory Visit: Payer: BC Managed Care – PPO | Admitting: Allergy

## 2021-01-09 ENCOUNTER — Encounter: Payer: Self-pay | Admitting: Allergy

## 2021-01-12 NOTE — Progress Notes (Deleted)
Follow Up Note  RE: Natasha Mcclain MRN: [id number] DOB: 05/10/1966 Date of Office Visit: 01/13/2021  Referring provider: [name], MD Primary care provider: [name], MD  Chief Complaint: No chief complaint on file.  Assessment and Plan: Natasha Mcclain is a 55 y.o. female with: No problem-specific Assessment & Plan notes found for this encounter.  No follow-ups on file.  Plan: Challenge drug: *** Challenge as per protocol: {Blank single:19197::"Passed","Failed"} Total time: ***  For next 24 hours monitor for hives, swelling, shortness of breath and dizziness. If you see these symptoms, use Benadryl for mild symptoms and epinephrine for more severe symptoms and call 911.  If no adverse symptoms in the next 24 hours then will take off drug from allergy list.  History of Present Illness: I had the pleasure of seeing Natasha Mcclain for a follow up visit at the [facility] on 01/12/2021. She is a 55 y.o. female, who is being followed for adverse drug reactions. Her previous allergy office visit was on 12/16/2020 with Dr. Maudie Mercury. Today she is here for COVID-19 drug testing and challenge.   History of Reaction: Drug reaction Patient had reaction in the form of throat closure/tightness within 1 minutes of receiving IV Paclitaxel for the first time in 2009. Treated with unknown IV medications and does not recall receiving IM epinephrine. Symptoms resolved after a few hours. Tolerated Abraxane which contains paclitaxel without the polyoxyl 35 product with no issues. Very interested in getting either Pfizer or Moderna mRNA Covid-19 vaccine. Received 2 J&J vaccines to date with no issues and no prior known Covid-19 infections. No other reactions to other pegylated products/drugs.  Today's skin prick and intradermal testing was negative toMiralax (source of PEG 3350), methylprednisolone acetate(source of PEG 3350), and triamcinolone acetonide(source of polysorbate-80). ? All was  done on right arm as patient prefers not to use left arm due to lack of lymph nodes after breast cancer surgery.   Patient tolerated oral Miralax drug challenge without any issues.  Discussed with patient at length that there are no current recommendations regarding what to do after 2 J&J vaccines however, the mRNA based vaccines seem to offer a better protection against the current Covid-19 variants circulating in the population.   Discussed the risks and benefits and patient is very interested in receiving an mRNA based vaccine due to her medical history.    Return for vaccine skin testing and graded challenge at 9AM on 12/23/2020. ? Skin prick test to [place] full strength, then intradermal testing to 1:100 dilution and 1:10 dilution.  ? Will decide whether to give Natasha Mcclain or Natasha Mcclain given testing results in a graded vaccine challenge.  Return in about 1 week (around 12/23/2020) for Drug challenge.  Interval History: Patient has not been ill, she has not had any accidental exposures to the culprit medication.   Recent/Current History: Pulmonary disease: {Blank single:19197::"yes","no"} Cardiac disease: {Blank single:19197::"yes","no"} Respiratory infection: {Blank single:19197::"yes","no"} Rash: {Blank single:19197::"yes","no"} Itch: {Blank single:19197::"yes","no"} Swelling: {Blank single:19197::"yes","no"} Cough: {Blank single:19197::"yes","no"} Shortness of breath: {Blank single:19197::"yes","no"} Runny/stuffy nose: {Blank single:19197::"yes","no"} Itchy eyes: {Blank single:19197::"yes","no"} Beta-blocker use: {Blank single:19197::"yes","no"}  Patient/guardian was informed of the test procedure with verbalized understanding of the risk of anaphylaxis. Consent was signed.   Last antihistamine use: *** Last beta-blocker use: ***  Medication List:  Current Outpatient Medications  Medication Sig Dispense Refill  . albuterol (VENTOLIN HFA) 108 (90 Base) MCG/ACT  inhaler TAKE 2 PUFFS BY MOUTH EVERY 6 HOURS AS NEEDED  FOR WHEEZE OR SHORTNESS OF BREATH 6.7 each 0  . amoxicillin-clavulanate (AUGMENTIN) 875-125 MG tablet Take 1 tablet by mouth 2 (two) times daily. (Patient not taking: Reported on 12/16/2020) 14 tablet 0  . amphetamine-dextroamphetamine (ADDERALL) 20 MG tablet Take 1 tablet (20 mg total) by mouth 2 (two) times daily. 60 tablet 0  . amphetamine-dextroamphetamine (ADDERALL) 20 MG tablet Take 1 tablet (20 mg total) by mouth 2 (two) times daily. 60 tablet 0  . amphetamine-dextroamphetamine (ADDERALL) 20 MG tablet Take 1 tablet (20 mg total) by mouth 2 (two) times daily. 60 tablet 0  . amphetamine-dextroamphetamine (ADDERALL) 20 MG tablet Take 1 tablet (20 mg total) by mouth 2 (two) times daily. 60 tablet 0  . [START ON 01/20/2021] amphetamine-dextroamphetamine (ADDERALL) 20 MG tablet Take 1 tablet (20 mg total) by mouth 2 (two) times daily. 60 tablet 0  . [START ON 02/19/2021] amphetamine-dextroamphetamine (ADDERALL) 20 MG tablet Take 1 tablet (20 mg total) by mouth 2 (two) times daily. 60 tablet 0  . Cetirizine HCl 10 MG CAPS Take 1 capsule (10 mg total) by mouth daily. 30 capsule 6  . diazepam (VALIUM) 10 MG tablet Take 1 tablet (10 mg total) by mouth at bedtime as needed (muscle spasm). 90 tablet 1  . fluticasone (FLONASE) 50 MCG/ACT nasal spray Place 1 spray into both nostrils daily. 16 g 6  . gabapentin (NEURONTIN) 100 MG capsule TAKE 1-3 CAPSULES BY MOUTH AT BEDTIME 270 capsule 3  . guaiFENesin-codeine 100-10 MG/5ML syrup Take 5 mLs by mouth every 6 (six) hours as needed for cough. 120 mL 0  . Olopatadine HCl 0.2 % SOLN ADMINISTER 1 DROP TO BOTH EYES TWO TIMES A DAY    . venlafaxine (EFFEXOR) 75 MG tablet Take 1 tablet (75 mg total) by mouth daily. 90 tablet 3   No current facility-administered medications for this visit.   Allergies: Allergies  Allergen Reactions  . Paclitaxel Anaphylaxis and Shortness Of Breath  . Other Other (See Comments)     Other reaction(s): Eye Redness Other reaction(s): Other (See Comments)  . Short Ragweed Pollen Ext     Other reaction(s): Eye Redness  . Tramadol Nausea And Vomiting    Nightmares and nausea and  vomiting    . Tape Rash   I reviewed her past medical history, social history, family history, and environmental history and no significant changes have been reported from her previous visit.  Review of Systems  Constitutional: Negative for appetite change, chills, fever and unexpected weight change.  HENT: Positive for congestion. Negative for rhinorrhea.   Eyes: Positive for itching.  Respiratory: Negative for cough, chest tightness, shortness of breath and wheezing.   Cardiovascular: Negative for chest pain.  Gastrointestinal: Negative for abdominal pain.  Genitourinary: Negative for difficulty urinating.  Skin: Negative for rash.  Neurological: Negative for headaches.   Objective: There were no vitals taken for this visit. There is no height or weight on file to calculate BMI. Physical Exam Vitals and nursing note reviewed.  Constitutional:      Appearance: Normal appearance. She is well-developed.  HENT:     Head: Normocephalic and atraumatic.     Right Ear: External ear normal.     Left Ear: External ear normal.     Nose: Nose normal.     Mouth/Throat:     Mouth: Mucous membranes are moist.     Pharynx: Oropharynx is clear.  Eyes:     Conjunctiva/sclera: Conjunctivae normal.  Cardiovascular:     Rate  and Rhythm: Normal rate and regular rhythm.     Heart sounds: Normal heart sounds. No murmur heard. No friction rub. No gallop.   Pulmonary:     Effort: Pulmonary effort is normal.     Breath sounds: Normal breath sounds. No wheezing, rhonchi or rales.  Abdominal:     Palpations: Abdomen is soft.  Musculoskeletal:     Cervical back: Neck supple.  Skin:    General: Skin is warm.     Findings: No rash.  Neurological:     Mental Status: She is alert and oriented to  person, place, and time.  Psychiatric:        Behavior: Behavior normal.     Diagnostics: Spirometry:  Tracings reviewed. Her effort: {Blank single:19197::"Good reproducible efforts.","It was hard to get consistent efforts and there is a question as to whether this reflects a maximal maneuver.","Poor effort, data can not be interpreted."} FVC: ***L FEV1: ***L, ***% predicted FEV1/FVC ratio: ***% Interpretation: {Blank single:19197::"Spirometry consistent with mild obstructive disease","Spirometry consistent with moderate obstructive disease","Spirometry consistent with severe obstructive disease","Spirometry consistent with possible restrictive disease","Spirometry consistent with mixed obstructive and restrictive disease","Spirometry uninterpretable due to technique","Spirometry consistent with normal pattern","No overt abnormalities noted given today's efforts"}.  Please see scanned spirometry results for details.  Skin Testing: {Blank single:19197::"None","Deferred due to recent antihistamines use"}. Positive test to: ***. Negative test to: ***.  Results discussed with patient/family.   Previous notes and tests were reviewed. The plan was reviewed with the patient/family, and all questions/concerned were addressed.  It was my pleasure to see Natasha Mcclain today and participate in her care. Please feel free to contact me with any questions or concerns.  Sincerely,  Rexene Alberts, DO Allergy & Immunology  [facility] office: [place] office: [phone]

## 2021-01-12 NOTE — Telephone Encounter (Signed)
Pt called to reschedule her appointment. Pt wanted Dr. Maudie Mercury to know if there were any issues getting any information from her oncologist to directly contact her. Can call Pt's cell phone but it is best to leave message on home answering machine.   Please advise.

## 2021-01-13 ENCOUNTER — Ambulatory Visit: Payer: BC Managed Care – PPO | Admitting: Allergy

## 2021-01-13 ENCOUNTER — Other Ambulatory Visit: Payer: Self-pay | Admitting: Family Medicine

## 2021-02-14 ENCOUNTER — Telehealth: Payer: Self-pay

## 2021-02-14 NOTE — Telephone Encounter (Signed)
Verbal OK given

## 2021-02-14 NOTE — Telephone Encounter (Signed)
Yes may refill RX for April's adderall early so she will have for out of town use when needed

## 2021-02-14 NOTE — Telephone Encounter (Signed)
Please advise if early refill is OK

## 2021-02-14 NOTE — Telephone Encounter (Signed)
Received call from [facility] requesting an early refill of Adderall 20 mg for pt. This prescription was last refilled on 01/20/2021 and pt is going out of town on 02/16/2021. Pt is wanting to get the prescription before she leaves. Please advise.

## 2021-03-03 NOTE — Progress Notes (Deleted)
Follow Up Note  RE: Natasha Mcclain MRN: [id number] DOB: 03-03-1966 Date of Office Visit: 03/04/2021  Referring provider: [name], MD Primary care provider: [name], MD  Chief Complaint: No chief complaint on file.  Assessment and Plan: Natasha Mcclain is a 55 y.o. female with: No problem-specific Assessment & Plan notes found for this encounter.  No follow-ups on file.  Plan: Challenge drug: *** Challenge as per protocol: {Blank single:19197::"Passed","Failed"} Total time: ***  For next 24 hours monitor for hives, swelling, shortness of breath and dizziness. If you see these symptoms, use Benadryl for mild symptoms and epinephrine for more severe symptoms and call 911.  If no adverse symptoms in the next 24 hours then will take off drug from allergy list.  History of Present Illness: I had the pleasure of seeing Natasha Mcclain for a follow up visit at the [facility] on 03/03/2021. She is a 55 y.o. female, who is being followed for drug reactions. Her previous allergy office visit was on 12/16/2020 with Dr. Maudie Mercury. Today she is here for Covid-19 vaccine drug testing and challenge.   History of Reaction: Drug reaction Patient had reaction in the form of throat closure/tightness within 1 minutes of receiving IV Paclitaxel for the first time in 2009. Treated with unknown IV medications and does not recall receiving IM epinephrine. Symptoms resolved after a few hours. Tolerated Abraxane which contains paclitaxel without the polyoxyl 35 product with no issues. Very interested in getting either Pfizer or Moderna mRNA Covid-19 vaccine. Received 2 J&J vaccines to date with no issues and no prior known Covid-19 infections. No other reactions to other pegylated products/drugs.  Today's skin prick and intradermal testing was negative toMiralax (source of PEG 3350), methylprednisolone acetate(source of PEG 3350), and triamcinolone acetonide(source of polysorbate-80). ? All was  done on right arm as patient prefers not to use left arm due to lack of lymph nodes after breast cancer surgery.   Patient tolerated oral Miralax drug challenge without any issues.  Discussed with patient at length that there are no current recommendations regarding what to do after 2 J&J vaccines however, the mRNA based vaccines seem to offer a better protection against the current Covid-19 variants circulating in the population.   Discussed the risks and benefits and patient is very interested in receiving an mRNA based vaccine due to her medical history.    Return for vaccine skin testing and graded challenge at 9AM on 12/23/2020. ? Skin prick test to [place] full strength, then intradermal testing to 1:100 dilution and 1:10 dilution.  ? Will decide whether to give [place] or [place] given testing results in a graded vaccine challenge.   Interval History: Patient has not been ill, she has not had any accidental exposures to the culprit medication.   Recent/Current History: Pulmonary disease: {Blank single:19197::"yes","no"} Cardiac disease: {Blank single:19197::"yes","no"} Respiratory infection: {Blank single:19197::"yes","no"} Rash: {Blank single:19197::"yes","no"} Itch: {Blank single:19197::"yes","no"} Swelling: {Blank single:19197::"yes","no"} Cough: {Blank single:19197::"yes","no"} Shortness of breath: {Blank single:19197::"yes","no"} Runny/stuffy nose: {Blank single:19197::"yes","no"} Itchy eyes: {Blank single:19197::"yes","no"} Beta-blocker use: {Blank single:19197::"yes","no"}  Patient/guardian was informed of the test procedure with verbalized understanding of the risk of anaphylaxis. Consent was signed.   Last antihistamine use: *** Last beta-blocker use: ***  Medication List:  Current Outpatient Medications  Medication Sig Dispense Refill  . albuterol (VENTOLIN HFA) 108 (90 Base) MCG/ACT inhaler TAKE 2 PUFFS BY MOUTH EVERY 6 HOURS AS NEEDED FOR WHEEZE OR  SHORTNESS OF BREATH 6.7 each 0  .  amoxicillin-clavulanate (AUGMENTIN) 875-125 MG tablet Take 1 tablet by mouth 2 (two) times daily. (Patient not taking: Reported on 12/16/2020) 14 tablet 0  . amphetamine-dextroamphetamine (ADDERALL) 20 MG tablet Take 1 tablet (20 mg total) by mouth 2 (two) times daily. 60 tablet 0  . amphetamine-dextroamphetamine (ADDERALL) 20 MG tablet Take 1 tablet (20 mg total) by mouth 2 (two) times daily. 60 tablet 0  . amphetamine-dextroamphetamine (ADDERALL) 20 MG tablet Take 1 tablet (20 mg total) by mouth 2 (two) times daily. 60 tablet 0  . amphetamine-dextroamphetamine (ADDERALL) 20 MG tablet Take 1 tablet (20 mg total) by mouth 2 (two) times daily. 60 tablet 0  . amphetamine-dextroamphetamine (ADDERALL) 20 MG tablet Take 1 tablet (20 mg total) by mouth 2 (two) times daily. 60 tablet 0  . amphetamine-dextroamphetamine (ADDERALL) 20 MG tablet Take 1 tablet (20 mg total) by mouth 2 (two) times daily. 60 tablet 0  . Cetirizine HCl 10 MG CAPS Take 1 capsule (10 mg total) by mouth daily. 30 capsule 6  . diazepam (VALIUM) 10 MG tablet Take 1 tablet (10 mg total) by mouth at bedtime as needed (muscle spasm). 90 tablet 1  . fluticasone (FLONASE) 50 MCG/ACT nasal spray Place 1 spray into both nostrils daily. 16 g 6  . gabapentin (NEURONTIN) 100 MG capsule TAKE 1-3 CAPSULES BY MOUTH AT BEDTIME 270 capsule 3  . guaiFENesin-codeine 100-10 MG/5ML syrup Take 5 mLs by mouth every 6 (six) hours as needed for cough. 120 mL 0  . Olopatadine HCl 0.2 % SOLN ADMINISTER 1 DROP TO BOTH EYES TWO TIMES A DAY    . venlafaxine (EFFEXOR) 75 MG tablet TAKE 1 TABLET BY MOUTH EVERY DAY 90 tablet 3   No current facility-administered medications for this visit.   Allergies: Allergies  Allergen Reactions  . Paclitaxel Anaphylaxis and Shortness Of Breath  . Other Other (See Comments)    Other reaction(s): Eye Redness Other reaction(s): Other (See Comments)  . Short Ragweed Pollen Ext     Other  reaction(s): Eye Redness  . Tramadol Nausea And Vomiting    Nightmares and nausea and  vomiting    . Tape Rash   I reviewed her past medical history, social history, family history, and environmental history and no significant changes have been reported from her previous visit.  Review of Systems  Constitutional: Negative for appetite change, chills, fever and unexpected weight change.  HENT: Positive for congestion. Negative for rhinorrhea.   Eyes: Positive for itching.  Respiratory: Negative for cough, chest tightness, shortness of breath and wheezing.   Cardiovascular: Negative for chest pain.  Gastrointestinal: Negative for abdominal pain.  Genitourinary: Negative for difficulty urinating.  Skin: Negative for rash.  Neurological: Negative for headaches.   Objective: There were no vitals taken for this visit. There is no height or weight on file to calculate BMI. Physical Exam Vitals and nursing note reviewed.  Constitutional:      Appearance: Normal appearance. She is well-developed.  HENT:     Head: Normocephalic and atraumatic.     Right Ear: External ear normal.     Left Ear: External ear normal.     Nose: Nose normal.     Mouth/Throat:     Mouth: Mucous membranes are moist.     Pharynx: Oropharynx is clear.  Eyes:     Conjunctiva/sclera: Conjunctivae normal.  Cardiovascular:     Rate and Rhythm: Normal rate and regular rhythm.     Heart sounds: Normal heart sounds. No murmur heard.  No friction rub. No gallop.   Pulmonary:     Effort: Pulmonary effort is normal.     Breath sounds: Normal breath sounds. No wheezing, rhonchi or rales.  Abdominal:     Palpations: Abdomen is soft.  Musculoskeletal:     Cervical back: Neck supple.  Skin:    General: Skin is warm.     Findings: No rash.  Neurological:     Mental Status: She is alert and oriented to person, place, and time.  Psychiatric:        Behavior: Behavior normal.     Diagnostics: Spirometry:   Tracings reviewed. Her effort: {Blank single:19197::"Good reproducible efforts.","It was hard to get consistent efforts and there is a question as to whether this reflects a maximal maneuver.","Poor effort, data can not be interpreted."} FVC: ***L FEV1: ***L, ***% predicted FEV1/FVC ratio: ***% Interpretation: {Blank single:19197::"Spirometry consistent with mild obstructive disease","Spirometry consistent with moderate obstructive disease","Spirometry consistent with severe obstructive disease","Spirometry consistent with possible restrictive disease","Spirometry consistent with mixed obstructive and restrictive disease","Spirometry uninterpretable due to technique","Spirometry consistent with normal pattern","No overt abnormalities noted given today's efforts"}.  Please see scanned spirometry results for details.  Skin Testing: {Blank single:19197::"None","Deferred due to recent antihistamines use"}. Positive test to: ***. Negative test to: ***.  Results discussed with patient/family.   Previous notes and tests were reviewed. The plan was reviewed with the patient/family, and all questions/concerned were addressed.  It was my pleasure to see Natasha Mcclain today and participate in her care. Please feel free to contact me with any questions or concerns.  Sincerely,  Rexene Alberts, DO Allergy & Immunology  [facility] office: [place] office: [phone]

## 2021-03-04 ENCOUNTER — Encounter: Payer: BC Managed Care – PPO | Admitting: Allergy

## 2021-03-06 ENCOUNTER — Other Ambulatory Visit: Payer: Self-pay | Admitting: Family Medicine

## 2021-03-06 DIAGNOSIS — M25572 Pain in left ankle and joints of left foot: Secondary | ICD-10-CM

## 2021-03-06 DIAGNOSIS — S82892D Other fracture of left lower leg, subsequent encounter for closed fracture with routine healing: Secondary | ICD-10-CM

## 2021-03-06 DIAGNOSIS — J989 Respiratory disorder, unspecified: Secondary | ICD-10-CM

## 2021-03-17 ENCOUNTER — Telehealth: Payer: Self-pay

## 2021-03-17 ENCOUNTER — Encounter: Payer: Self-pay | Admitting: Family Medicine

## 2021-03-17 DIAGNOSIS — F902 Attention-deficit hyperactivity disorder, combined type: Secondary | ICD-10-CM

## 2021-03-17 NOTE — Telephone Encounter (Signed)
  LAST APPOINTMENT DATE: 09/22/2020  NEXT APPOINTMENT DATE:@Visit  date not found  MEDICATION: amphetamine-dextroamphetamine (ADDERALL) 20 MG tablet  PHARMACY: [id number] - , [place] - [address]. [address]  Comments: Patient is leaving on Monday to [place] is wondering if she can get the prescription refilled early.   Please advise

## 2021-03-18 MED ORDER — AMPHETAMINE-DEXTROAMPHETAMINE 20 MG PO TABS: 20.0000 mg | ORAL_TABLET | Freq: Two times a day (BID) | ORAL | 0 refills | Status: DC

## 2021-03-18 NOTE — Telephone Encounter (Signed)
Called and spoke with patient, she has been scheduled for an ADD check

## 2021-03-18 NOTE — Telephone Encounter (Signed)
Please call patient. I have refilled Adderall Needs to schedule a visit for ADD recheck. Needs to be seen in office at least every 6 months for ADD due to controlled substance.  Please have her schedule. A virtual visit would be fine.   Won't be able to fill Adderall again for next month w/o an OV. Thanks!

## 2021-03-18 NOTE — Addendum Note (Signed)
Addended by: Billey Chang on: 03/18/2021 08:09 AM   Modules accepted: Orders

## 2021-03-18 NOTE — Telephone Encounter (Signed)
Patient requesting Adderall refill   Last Refill 02/19/2021 Last OV 09/22/2020 dx Annual Physical Exam

## 2021-03-30 ENCOUNTER — Other Ambulatory Visit: Payer: Self-pay

## 2021-03-30 ENCOUNTER — Ambulatory Visit (INDEPENDENT_AMBULATORY_CARE_PROVIDER_SITE_OTHER): Payer: BC Managed Care – PPO | Admitting: Family Medicine

## 2021-03-30 ENCOUNTER — Encounter: Payer: Self-pay | Admitting: Family Medicine

## 2021-03-30 VITALS — BP 124/88 | HR 78 | Temp 97.5°F | Ht 69.0 in | Wt 159.6 lb

## 2021-03-30 DIAGNOSIS — Z23 Encounter for immunization: Secondary | ICD-10-CM | POA: Diagnosis not present

## 2021-03-30 DIAGNOSIS — Z79899 Other long term (current) drug therapy: Secondary | ICD-10-CM

## 2021-03-30 DIAGNOSIS — J302 Other seasonal allergic rhinitis: Secondary | ICD-10-CM | POA: Diagnosis not present

## 2021-03-30 DIAGNOSIS — F339 Major depressive disorder, recurrent, unspecified: Secondary | ICD-10-CM | POA: Diagnosis not present

## 2021-03-30 DIAGNOSIS — M62838 Other muscle spasm: Secondary | ICD-10-CM

## 2021-03-30 DIAGNOSIS — F902 Attention-deficit hyperactivity disorder, combined type: Secondary | ICD-10-CM

## 2021-03-30 MED ORDER — FLUTICASONE PROPIONATE 50 MCG/ACT NA SUSP: 1.0000 | Freq: Every day | NASAL | 6 refills | Status: DC

## 2021-03-30 MED ORDER — AMPHETAMINE-DEXTROAMPHETAMINE 20 MG PO TABS: 20.0000 mg | ORAL_TABLET | Freq: Two times a day (BID) | ORAL | 0 refills | Status: DC

## 2021-03-30 MED ORDER — DIAZEPAM 10 MG PO TABS: 10.0000 mg | ORAL_TABLET | Freq: Every evening | ORAL | 1 refills | Status: DC | PRN

## 2021-03-30 NOTE — Patient Instructions (Addendum)
Please return in 6 months for your annual complete physical; please come fasting.  I've refilled your medications.   If you have any questions or concerns, please don't hesitate to send me a message via MyChart or call the office at [phone]. Thank you for visiting with [place] today! It's our pleasure caring for you.

## 2021-03-30 NOTE — Progress Notes (Signed)
Subjective  CC:  Chief Complaint  Patient presents with  . ADHD    Adderall refills  . Medication Refill    Diazepam, Flonase, Venlaflaxine    HPI: Natasha Mcclain is a 55 y.o. female who presents to the office today to address the problems listed above in the chief complaint.   Patient is here today for follow up of ADD/ADHD. She is taking medication as directed and continues to feel it is beneficial. The medications continue to help with focus and attention and task completion. She denies adverse side effects; specifically no headaches, appetite suppression, weight loss, sleeping difficulty, heart palpitations, chest pain or significant weight changes.  This patient does not have contraindications for stimulant use include hypertension, tachycardia, arrhythmia, psychosis, bipolar disorder, severe anorexia, and Tourette syndrome.   Depression: doing well on long term venlafaxine. No AEs.   Neck mm spasms. Ran out of valium. Has used less frequently. Prefers over flexeril. I reviewed patient's records from the PMP aware controlled substance registry today.   Allergies are active; uses flonasd with fairly good control. Needs refill  HM: eligible for shingrix vaccine. Your younger sister had shingles in December: she wants to prevent if.    Assessment  1. Attention deficit hyperactivity disorder (ADHD), combined type   2. Chronic prescription benzodiazepine use   3. Major depression, recurrent, chronic ([place])   4. Seasonal allergies   5. Muscle spasms of neck      Plan   ADD:  Well controlled. Refilled meds x 6 months. adderall xr 20 daily  Discussed tx of mm spasms: she practices yoga daily, stretches, heat etc. Refilled benzo; discussed r vs b. Recommend prn use and half dose if possible.   Mood/depression is well controlled. Venlafaxine 50 daily refilled  Allergies: oral antihistamine and flonase.   1st shingrix dose given in office today; next in 6 months  Follow up:  6 months for cpe, f/u add, 2nd shingrix  No orders of the defined types were placed in this encounter.  Meds ordered this encounter  Medications  . amphetamine-dextroamphetamine (ADDERALL) 20 MG tablet    Sig: Take 1 tablet (20 mg total) by mouth 2 (two) times daily.    Dispense:  60 tablet    Refill:  0  . fluticasone (FLONASE) 50 MCG/ACT nasal spray    Sig: Place 1 spray into both nostrils daily.    Dispense:  16 g    Refill:  6  . amphetamine-dextroamphetamine (ADDERALL) 20 MG tablet    Sig: Take 1 tablet (20 mg total) by mouth 2 (two) times daily.    Dispense:  60 tablet    Refill:  0  . amphetamine-dextroamphetamine (ADDERALL) 20 MG tablet    Sig: Take 1 tablet (20 mg total) by mouth 2 (two) times daily.    Dispense:  60 tablet    Refill:  0  . diazepam (VALIUM) 10 MG tablet    Sig: Take 1 tablet (10 mg total) by mouth at bedtime as needed (muscle spasm).    Dispense:  90 tablet    Refill:  1  . amphetamine-dextroamphetamine (ADDERALL) 20 MG tablet    Sig: Take 1 tablet (20 mg total) by mouth 2 (two) times daily.    Dispense:  60 tablet    Refill:  0  . amphetamine-dextroamphetamine (ADDERALL) 20 MG tablet    Sig: Take 1 tablet (20 mg total) by mouth 2 (two) times daily.    Dispense:  60 tablet  Refill:  0  . amphetamine-dextroamphetamine (ADDERALL) 20 MG tablet    Sig: Take 1 tablet (20 mg total) by mouth 2 (two) times daily.    Dispense:  60 tablet    Refill:  0      I reviewed the patients updated PMH, FH, and SocHx.    Patient Active Problem List   Diagnosis Date Noted  . Chronic prescription benzodiazepine use 12/29/2019    Priority: High  . Attention deficit hyperactivity disorder (ADHD), combined type 01/13/2019    Priority: High  . History of left breast cancer 2009 08/13/2013    Priority: High  . Major depression, recurrent, chronic ([place]) 07/06/2011    Priority: High  . Family history of colon cancer 12/29/2019    Priority: Medium  . Muscle spasms  of neck 01/13/2019    Priority: Medium  . Allergic conjunctivitis 12/29/2019    Priority: Low  . Seasonal allergies 01/13/2019    Priority: Low  . Ovarian cyst 01/06/2011    Priority: Low  . Drug reaction 12/17/2020  . Perimenopausal 12/29/2019   Current Meds  Medication Sig  . Cetirizine HCl 10 MG CAPS Take 1 capsule (10 mg total) by mouth daily.  Marland Kitchen gabapentin (NEURONTIN) 100 MG capsule TAKE 1-3 CAPSULES BY MOUTH AT BEDTIME  . Olopatadine HCl 0.2 % SOLN Gets OTC now  . venlafaxine (EFFEXOR) 75 MG tablet TAKE 1 TABLET BY MOUTH EVERY DAY  . [DISCONTINUED] amphetamine-dextroamphetamine (ADDERALL) 20 MG tablet Take 1 tablet (20 mg total) by mouth 2 (two) times daily.  . [DISCONTINUED] diazepam (VALIUM) 10 MG tablet Take 1 tablet (10 mg total) by mouth at bedtime as needed (muscle spasm).  . [DISCONTINUED] fluticasone (FLONASE) 50 MCG/ACT nasal spray Place 1 spray into both nostrils daily.    Allergies: Patient is allergic to paclitaxel, other, short ragweed pollen ext, tramadol, and tape. Family History: Patient family history includes Breast cancer in her mother; Cancer in her sister; Colon cancer in her cousin; Diabetes in her brother, mother, and sister; Early death in her father and mother; Hypertension in her sister; Miscarriages / Stillbirths in her mother. Social History:  Patient  reports that she has never smoked. She has never used smokeless tobacco. She reports current alcohol use. She reports previous drug use.  Review of Systems: Constitutional: Negative for fever malaise or anorexia Cardiovascular: negative for chest pain Respiratory: negative for SOB or persistent cough Gastrointestinal: negative for abdominal pain  Objective  Vitals: BP 124/88   Pulse 78   Temp (!) 97.5 F (36.4 C) (Temporal)   Ht 5\' 9"  (1.753 m)   Wt 159 lb 9.6 oz (72.4 kg)   SpO2 98%   BMI 23.57 kg/m  General: no acute distress , A&Ox3   Commons side effects, risks, benefits, and  alternatives for medications and treatment plan prescribed today were discussed, and the patient expressed understanding of the given instructions. Patient is instructed to call or message via MyChart if he/she has any questions or concerns regarding our treatment plan. No barriers to understanding were identified. We discussed Red Flag symptoms and signs in detail. Patient expressed understanding regarding what to do in case of urgent or emergency type symptoms.   Medication list was reconciled, printed and provided to the patient in AVS. Patient instructions and summary information was reviewed with the patient as documented in the AVS. This note was prepared with assistance of Dragon voice recognition software. Occasional wrong-word or sound-a-like substitutions may have occurred due to the inherent  limitations of voice recognition software

## 2021-03-31 ENCOUNTER — Encounter: Payer: Self-pay | Admitting: Family Medicine

## 2021-03-31 NOTE — Telephone Encounter (Signed)
Called patient and offered an appointment with another provider but patient does not want to see anyone but Dr.Andy. Okay to use a same day slot?

## 2021-04-01 MED ORDER — FLUCONAZOLE 150 MG PO TABS
ORAL_TABLET | ORAL | 0 refills | Status: DC
Start: 2021-04-01 — End: 2021-06-02

## 2021-05-09 ENCOUNTER — Encounter: Payer: BC Managed Care – PPO | Admitting: Allergy

## 2021-05-11 ENCOUNTER — Telehealth: Payer: Self-pay

## 2021-05-11 NOTE — Telephone Encounter (Signed)
Attempted to contact patient, not VM set up. I spoke with [facility], the earliest the medication can be filled is 7/2. If patient calls back, please get the info for a pharmacy where she is going so we can send script there if she will be out of town before 7/2.

## 2021-05-11 NOTE — Telephone Encounter (Signed)
Pt called and requested a refill for Adderall before the holiday? Her refill is due on the 05/16/21. She also, stated she would be going out of town for the holidays. Can it be sent early?

## 2021-05-11 NOTE — Telephone Encounter (Signed)
Called pt and she said that she would pick it up on 7/2 at the [facility] here

## 2021-06-02 ENCOUNTER — Other Ambulatory Visit: Payer: Self-pay | Admitting: Family Medicine

## 2021-06-02 ENCOUNTER — Telehealth (INDEPENDENT_AMBULATORY_CARE_PROVIDER_SITE_OTHER): Payer: BC Managed Care – PPO | Admitting: Family Medicine

## 2021-06-02 ENCOUNTER — Encounter: Payer: Self-pay | Admitting: Family Medicine

## 2021-06-02 DIAGNOSIS — U071 COVID-19: Secondary | ICD-10-CM

## 2021-06-02 DIAGNOSIS — M25572 Pain in left ankle and joints of left foot: Secondary | ICD-10-CM

## 2021-06-02 DIAGNOSIS — J989 Respiratory disorder, unspecified: Secondary | ICD-10-CM

## 2021-06-02 DIAGNOSIS — S82892D Other fracture of left lower leg, subsequent encounter for closed fracture with routine healing: Secondary | ICD-10-CM

## 2021-06-02 MED ORDER — BENZONATATE 100 MG PO CAPS: 100.0000 mg | ORAL_CAPSULE | Freq: Three times a day (TID) | ORAL | 0 refills | Status: DC | PRN

## 2021-06-02 MED ORDER — MOLNUPIRAVIR EUA 200MG CAPSULE: 4.0000 | ORAL_CAPSULE | Freq: Two times a day (BID) | ORAL | 0 refills | Status: AC

## 2021-06-02 NOTE — Progress Notes (Signed)
Virtual Visit via Video Note  I connected with Natasha Mcclain  on 06/02/21 at  1:00 PM EDT by a video enabled telemedicine application and verified that I am speaking with the correct person using two identifiers.  Location patient: home, [place] Location provider:work or home office Persons participating in the virtual visit: patient, provider  I discussed the limitations of evaluation and management by telemedicine and the availability of in person appointments. The patient expressed understanding and agreed to proceed.   HPI:  Acute telemedicine visit for Covid19: -Onset: 2 day, did home covid test which was positive yesterday -Symptoms include: congestion, mild cough, body aches, chills, HA -Denies:fever, CP, SOB (other than mild yesterday), NVD, inability to eat/get/out of bed -Has tried:musinex -Pertinent past medical history: see below - hx of breast cancer, depression -Pertinent medication allergies:  Allergies  Allergen Reactions   Paclitaxel Anaphylaxis and Shortness Of Breath   Other Other (See Comments)    Other reaction(s): Eye Redness Other reaction(s): Other (See Comments)   Short Ragweed Pollen Ext     Other reaction(s): Eye Redness   Tramadol Nausea And Vomiting    Nightmares and nausea and  vomiting     Tape Rash  -COVID-19 vaccine status: [employer] vaccine x2  ROS: See pertinent positives and negatives per HPI.  Past Medical History:  Diagnosis Date   Allergic conjunctivitis 12/29/2019   Allergy    Arthritis    Breast cancer ([place])    Depression    Eczema    Family history of colon cancer 12/29/2019   1st cousin father's side, 26, female 1st cousin father's side, 67s, female   Genital warts     Past Surgical History:  Procedure Laterality Date   BREAST BIOPSY  2009   BREAST RECONSTRUCTION Left 2010   breast reduction on right     MASTECTOMY Left      Current Outpatient Medications:    amphetamine-dextroamphetamine (ADDERALL) 20 MG tablet, Take 1 tablet (20 mg  total) by mouth 2 (two) times daily., Disp: 60 tablet, Rfl: 0   [START ON 06/28/2021] amphetamine-dextroamphetamine (ADDERALL) 20 MG tablet, Take 1 tablet (20 mg total) by mouth 2 (two) times daily., Disp: 60 tablet, Rfl: 0   [START ON 07/28/2021] amphetamine-dextroamphetamine (ADDERALL) 20 MG tablet, Take 1 tablet (20 mg total) by mouth 2 (two) times daily., Disp: 60 tablet, Rfl: 0   [START ON 08/27/2021] amphetamine-dextroamphetamine (ADDERALL) 20 MG tablet, Take 1 tablet (20 mg total) by mouth 2 (two) times daily., Disp: 60 tablet, Rfl: 0   benzonatate (TESSALON PERLES) 100 MG capsule, Take 1 capsule (100 mg total) by mouth 3 (three) times daily as needed., Disp: 20 capsule, Rfl: 0   Cetirizine HCl 10 MG CAPS, Take 1 capsule (10 mg total) by mouth daily., Disp: 30 capsule, Rfl: 6   diazepam (VALIUM) 10 MG tablet, Take 1 tablet (10 mg total) by mouth at bedtime as needed (muscle spasm)., Disp: 90 tablet, Rfl: 1   fluticasone (FLONASE) 50 MCG/ACT nasal spray, Place 1 spray into both nostrils daily., Disp: 16 g, Rfl: 6   gabapentin (NEURONTIN) 100 MG capsule, TAKE 1-3 CAPSULES BY MOUTH AT BEDTIME, Disp: 270 capsule, Rfl: 0   molnupiravir EUA 200 mg CAPS, Take 4 capsules (800 mg total) by mouth 2 (two) times daily for 5 days., Disp: 40 capsule, Rfl: 0   Olopatadine HCl 0.2 % SOLN, Gets OTC now, Disp: , Rfl:    venlafaxine (EFFEXOR) 75 MG tablet, TAKE 1 TABLET BY MOUTH  EVERY DAY, Disp: 90 tablet, Rfl: 3   amphetamine-dextroamphetamine (ADDERALL) 20 MG tablet, Take 1 tablet (20 mg total) by mouth 2 (two) times daily., Disp: 60 tablet, Rfl: 0   amphetamine-dextroamphetamine (ADDERALL) 20 MG tablet, Take 1 tablet (20 mg total) by mouth 2 (two) times daily., Disp: 60 tablet, Rfl: 0  EXAM:  VITALS per patient if applicable:  GENERAL: alert, oriented, appears well and in no acute distress  HEENT: atraumatic, conjunttiva clear, no obvious abnormalities on inspection of external nose and ears  NECK:  normal movements of the head and neck  LUNGS: on inspection no signs of respiratory distress, breathing rate appears normal, no obvious gross SOB, gasping or wheezing  CV: no obvious cyanosis  MS: moves all visible extremities without noticeable abnormality  PSYCH/NEURO: pleasant and cooperative, no obvious depression or anxiety, speech and thought processing grossly intact  ASSESSMENT AND PLAN:  Discussed the following assessment and plan:  COVID-19   Discussed treatment options, ideal treatment window, potential complications, isolation and precautions for COVID-19.  After lengthy discussion, the patient opted for treatment with molnupiravir due to being higher risk for complications of covid or severe disease and other factors. Discussed EUA status of this drug and the fact that there is preliminary limited knowledge of risks/interactions/side effects per EUA document vs possible benefits and precautions. This information was shared with patient during the visit and also was provided in patient instructions. Also, advised that patient discuss risks/interactions and use with pharmacist/treatment team as well.  The patient did want a prescription for cough, Tessalon Rx sent.  Other symptomatic care measures summarized in patient instructions. Work/School slipped offered: provided in patient instructions Follow up:Advised to seek prompt video visit or in person care if worsening, new symptoms arise, or if is not improving with treatment. Discussed options for inperson care if PCP office not available. Did let this patient know that I only do telemedicine on Tuesdays and Thursdays for [place]. Advised to schedule follow up visit with PCP or [facility] if any further questions or concerns to avoid delays in care.   I discussed the assessment and treatment plan with the patient. The patient was provided an opportunity to ask questions and all were answered. The patient agreed with the plan and demonstrated  an understanding of the instructions.     Lucretia Kern, DO

## 2021-06-02 NOTE — Patient Instructions (Signed)
---------------------------------------------------------------------------------------------------------------------------    WORK SLIP:  Patient Natasha Mcclain,  11/09/1966, was seen for a medical visit today, 06/02/21 . Please excuse from work for a COVID like illness. We advise 10 days minimum from the onset of symptoms (05/31/21) PLUS 1 day of no fever and improved symptoms. Will defer to employer for a sooner return to work if symptoms have resolved, it is greater than 5 days since the positive test and the patient can wear a high-quality, tight fitting mask such as N95 or KN95 at all times for an additional 5 days. Would also suggest COVID19 antigen testing is negative prior to return.  Sincerely: E-signature: Dr. Colin Benton, DO [facility] Ph: [phone]   ------------------------------------------------------------------------------------------------------------------------------  HOME CARE TIPS:    -I sent the medication(s) we discussed to your pharmacy: Meds ordered this encounter  Medications   molnupiravir EUA 200 mg CAPS    Sig: Take 4 capsules (800 mg total) by mouth 2 (two) times daily for 5 days.    Dispense:  40 capsule    Refill:  0   benzonatate (TESSALON PERLES) 100 MG capsule    Sig: Take 1 capsule (100 mg total) by mouth 3 (three) times daily as needed.    Dispense:  20 capsule    Refill:  0     -I sent in the [facility] treatment or referral you requested per our discussion. Please see the information provided below and discuss further with the pharmacist/treatment team.   -can use tylenol or aleve if needed for fevers, aches and pains per instructions  -can use nasal saline a few times per day if you have nasal congestion; sometimes  a short course of Afrin nasal spray for 3 days can help with symptoms as well  -stay hydrated, drink plenty of fluids and eat small healthy meals - avoid dairy  -If the Covid test is positive,  check out the [facility] website for more information on home care, transmission and treatment for COVID19  -follow up with your doctor in 2-3 days unless improving and feeling better  -stay home while sick, except to seek medical care. If you have COVID19, ideally it would be best to stay home for a full 10 days since the onset of symptoms PLUS one day of no fever and feeling better. Wear a good mask that fits snugly (such as N95 or KN95) if around others to reduce the risk of transmission.  It was nice to meet you today, and I really hope you are feeling better soon. I help [facility] out with telemedicine visits on Tuesdays and Thursdays and am available for visits on those days. If you have any concerns or questions following this visit please schedule a follow up visit with your Primary Care doctor or seek care at a local urgent care clinic to avoid delays in care.    Seek in person care or schedule a follow up video visit promptly if your symptoms worsen, new concerns arise or you are not improving with treatment. Call 911 and/or seek emergency care if your symptoms are severe or life threatening.    Fact Sheet for Patients And Caregivers Emergency Use Authorization (EUA) Of LAGEVRIOT (molnupiravir) capsules For Coronavirus Disease 2019 (COVID-19)  What is the most important information I should know about LAGEVRIO? LAGEVRIO may cause serious side effects, including: ? LAGEVRIO may cause harm to your unborn baby. It is not known if LAGEVRIO will harm your baby if you take  LAGEVRIO during pregnancy. o LAGEVRIO is not recommended for use in pregnancy. o LAGEVRIO has not been studied in pregnancy. LAGEVRIO was studied in pregnant animals only. When LAGEVRIO was given to pregnant animals, LAGEVRIO caused harm to their unborn babies. o You and your healthcare provider may decide that you should take LAGEVRIO during pregnancy if there are no other COVID-19 treatment options approved or authorized  by the FDA that are accessible or clinically appropriate for you. o If you and your healthcare provider decide that you should take LAGEVRIO during pregnancy, you and your healthcare provider should discuss the known and potential benefits and the potential risks of taking LAGEVRIO during pregnancy. For individuals who are able to become pregnant: ? You should use a reliable method of birth control (contraception) consistently and correctly during treatment with LAGEVRIO and for 4 days after the last dose of LAGEVRIO. Talk to your healthcare provider about reliable birth control methods. ? Before starting treatment with [facility] your healthcare provider may do a pregnancy test to see if you are pregnant before starting treatment with LAGEVRIO. ? Tell your healthcare provider right away if you become pregnant or think you may be pregnant during treatment with LAGEVRIO. Pregnancy Surveillance Program: ? There is a pregnancy surveillance program for individuals who take LAGEVRIO during pregnancy. The purpose of this program is to collect information about the health of you and your baby. Talk to your healthcare provider about how to take part in this program. ? If you take LAGEVRIO during pregnancy and you agree to participate in the pregnancy surveillance program and allow your healthcare provider to share your information with [facility], then your healthcare provider will report your use of [place] during pregnancy to [facility]. by calling [phone] or [email or web address]. For individuals who are sexually active with partners who are able to become pregnant: ? It is not known if LAGEVRIO can affect sperm. While the risk is regarded as low, animal studies to fully assess the potential for LAGEVRIO to affect the babies of males treated with LAGEVRIO have not been completed. A reliable method of birth control (contraception) should be used consistently and  correctly during treatment with LAGEVRIO and for at least 3 months after the last dose. The risk to sperm beyond 3 months is not known. Studies to understand the risk to sperm beyond 3 months are ongoing. Talk to your healthcare provider about reliable birth control methods. Talk to your healthcare provider if you have questions or concerns about how LAGEVRIO may affect sperm. You are being given this fact sheet because your healthcare provider believes it is necessary to provide you with LAGEVRIO for the treatment of adults with mild-to-moderate coronavirus disease 2019 (COVID-19) with positive results of direct SARS-CoV-2 viral testing, and who are at high risk for progression to severe COVID-19 including hospitalization or death, and for whom other COVID-19 treatment options approved or authorized by the FDA are not accessible or clinically appropriate. The U.S. Food and Drug Administration (FDA) has issued an Emergency Use Authorization (EUA) to make LAGEVRIO available during the COVID-19 pandemic (for more details about an EUA please see "What is an Emergency Use Authorization?" at the end of this document). LAGEVRIO is not an FDA-approved medicine in the [place]. Read this Fact Sheet for information about LAGEVRIO. Talk to your healthcare provider about your options if you have any questions. It is your choice to take LAGEVRIO.  What is COVID-19? COVID-19 is caused by a  virus called a coronavirus. You can get COVID-19 through close contact with another person who has the virus. COVID-19 illnesses have ranged from very mild-to-severe, including illness resulting in death. While information so far suggests that most COVID-19 illness is mild, serious illness can happen and may cause some of your other medical conditions to become worse. Older people and people of all ages with severe, long lasting (chronic) medical conditions like heart disease, lung disease and diabetes, for  example seem to be at higher risk of being hospitalized for COVID-19.  What is LAGEVRIO? LAGEVRIO is an investigational medicine used to treat mild-to-moderate COVID-19 in adults: ? with positive results of direct SARS-CoV-2 viral testing, and ? who are at high risk for progression to severe COVID-19 including hospitalization or death, and for whom other COVID-19 treatment options approved or authorized by the FDA are not accessible or clinically appropriate. The FDA has authorized the emergency use of LAGEVRIO for the treatment of mild-tomoderate COVID-19 in adults under an EUA. For more information on EUA, see the "What is an Emergency Use Authorization (EUA)?" section at the end of this Fact Sheet. LAGEVRIO is not authorized: ? for use in people less than 1 years of age. ? for prevention of COVID-19. ? for people needing hospitalization for COVID-19. ? for use for longer than 5 consecutive days.  What should I tell my healthcare provider before I take LAGEVRIO? Tell your healthcare provider if you: ? Have any allergies ? Are breastfeeding or plan to breastfeed ? Have any serious illnesses ? Are taking any medicines (prescription, over-the-counter, vitamins, or herbal products).  How do I take LAGEVRIO? ? Take LAGEVRIO exactly as your healthcare provider tells you to take it. ? Take 4 capsules of LAGEVRIO every 12 hours (for example, at 8 am and at 8 pm) ? Take LAGEVRIO for 5 days. It is important that you complete the full 5 days of treatment with LAGEVRIO. Do not stop taking LAGEVRIO before you complete the full 5 days of treatment, even if you feel better. ? Take LAGEVRIO with or without food. ? You should stay in isolation for as long as your healthcare provider tells you to. Talk to your healthcare provider if you are not sure about how to properly isolate while you have COVID-19. ? Swallow LAGEVRIO capsules whole. Do not open, break, or crush the capsules. If you cannot  swallow capsules whole, tell your healthcare provider. ? What to do if you miss a dose: o If it has been less than 10 hours since the missed dose, take it as soon as you remember o If it has been more than 10 hours since the missed dose, skip the missed dose and take your dose at the next scheduled time. ? Do not double the dose of LAGEVRIO to make up for a missed dose.  What are the important possible side effects of LAGEVRIO? ? See, "What is the most important information I should know about LAGEVRIO?" ? Allergic Reactions. Allergic reactions can happen in people taking LAGEVRIO, even after only 1 dose. Stop taking LAGEVRIO and call your healthcare provider right away if you get any of the following symptoms of an allergic reaction: o hives o rapid heartbeat o trouble swallowing or breathing o swelling of the mouth, lips, or face o throat tightness o hoarseness o skin rash The most common side effects of LAGEVRIO are: ? diarrhea ? nausea ? dizziness These are not all the possible side effects of LAGEVRIO. Not many  people have taken [place]. Serious and unexpected side effects may happen. This medicine is still being studied, so it is possible that all of the risks are not known at this time.  What other treatment choices are there?  Veklury (remdesivir) is FDA-approved as an intravenous (IV) infusion for the treatment of mildto-moderate ZOXWR-60 in certain adults and children. Talk with your doctor to see if Marijean Heath is appropriate for you. Like LAGEVRIO, FDA may also allow for the emergency use of other medicines to treat people with COVID-19. Go to LacrosseProperties.[email or web address] for more information. It is your choice to be treated or not to be treated with LAGEVRIO. Should you decide not to take it, it will not change your standard medical care.  What if I am  breastfeeding? Breastfeeding is not recommended during treatment with LAGEVRIO and for 4 days after the last dose of LAGEVRIO. If you are breastfeeding or plan to breastfeed, talk to your healthcare provider about your options and specific situation before taking LAGEVRIO.  How do I report side effects with LAGEVRIO? Contact your healthcare provider if you have any side effects that bother you or do not go away. Report side effects to FDA MedWatch at SmoothHits.hu or call 1-800-FDA-1088 (1- 253-704-9968).  How should I store [place]? ? Store LAGEVRIO capsules at room temperature between 29F to 33F (20C to 25C). ? Keep LAGEVRIO and all medicines out of the reach of children and pets. How can I learn more about COVID-19? ? Ask your healthcare provider. ? Visit SeekRooms.co.uk ? Contact your local or state public health department. ? Call [place] [place] at [phone] (toll free in the [place] ? Visit [email or web address].com  What Is an Emergency Use Authorization (EUA)? The [place] FDA has made [place] available under an emergency access mechanism called an Emergency Use Authorization (EUA) The EUA is supported by a [place], [place] Health and Human Service (HHS) declaration that circumstances exist to justify emergency use of drugs and biological products during the COVID-19 pandemic. LAGEVRIO for the treatment of mild-to-moderate COVID-19 in adults with positive results of direct SARS-CoV-2 viral testing, who are at high risk for progression to severe COVID-19, including hospitalization or death, and for whom alternative COVID-19 treatment options approved or authorized by FDA are not accessible or clinically appropriate, has not undergone the same type of review as an FDA-approved product. In issuing an EUA under the NWGNF-62 public health emergency, the FDA has determined, among other things, that based on the total amount of scientific evidence available  including data from adequate and well-controlled clinical trials, if available, it is reasonable to believe that the product may be effective for diagnosing, treating, or preventing COVID-19, or a serious or life-threatening disease or condition caused by COVID-19; that the known and potential benefits of the product, when used to diagnose, treat, or prevent such disease or condition, outweigh the known and potential risks of such product; and that there are no adequate, approved, and available alternatives.  All of these criteria must be met to allow for the product to be used in the treatment of patients during the COVID-19 pandemic. The EUA for LAGEVRIO is in effect for the duration of the COVID-19 declaration justifying emergency use of LAGEVRIO, unless terminated or revoked (after which LAGEVRIO may no longer be used under the EUA). For patent information: http://rogers.info/ Copyright  2021-2022 [place] [place], [place] and its affiliates. All rights reserved. [id number] Revised: March 2022

## 2021-06-09 ENCOUNTER — Telehealth: Payer: Self-pay

## 2021-06-09 NOTE — Telephone Encounter (Addendum)
Patient is calling in stating that she was seen by Dr.Kim for COVID and was prescribed medication. She took all of it and some of her symptoms have been relieved but still cant get over her symptoms and is wondering if there is counter meds she can take to help relieve symptoms.

## 2021-06-10 NOTE — Telephone Encounter (Signed)
Please advise 

## 2021-06-14 NOTE — Telephone Encounter (Signed)
Mychart message sent.

## 2021-07-14 ENCOUNTER — Encounter: Payer: Self-pay | Admitting: Family Medicine

## 2021-07-26 DIAGNOSIS — Z170 Estrogen receptor positive status [ER+]: Secondary | ICD-10-CM | POA: Diagnosis not present

## 2021-07-26 DIAGNOSIS — C50912 Malignant neoplasm of unspecified site of left female breast: Secondary | ICD-10-CM | POA: Diagnosis not present

## 2021-07-26 DIAGNOSIS — Z1231 Encounter for screening mammogram for malignant neoplasm of breast: Secondary | ICD-10-CM | POA: Diagnosis not present

## 2021-07-26 DIAGNOSIS — Z6823 Body mass index (BMI) 23.0-23.9, adult: Secondary | ICD-10-CM | POA: Diagnosis not present

## 2021-07-26 DIAGNOSIS — C50919 Malignant neoplasm of unspecified site of unspecified female breast: Secondary | ICD-10-CM | POA: Diagnosis not present

## 2021-08-12 ENCOUNTER — Other Ambulatory Visit: Payer: Self-pay | Admitting: Family Medicine

## 2021-08-24 ENCOUNTER — Encounter: Payer: Self-pay | Admitting: Family Medicine

## 2021-08-30 ENCOUNTER — Telehealth: Payer: Self-pay

## 2021-08-30 ENCOUNTER — Encounter: Payer: Self-pay | Admitting: Family Medicine

## 2021-08-30 ENCOUNTER — Other Ambulatory Visit: Payer: Self-pay

## 2021-08-30 ENCOUNTER — Ambulatory Visit (INDEPENDENT_AMBULATORY_CARE_PROVIDER_SITE_OTHER): Payer: BC Managed Care – PPO | Admitting: Family Medicine

## 2021-08-30 VITALS — BP 118/80 | HR 91 | Temp 98.0°F | Ht 69.0 in | Wt 158.2 lb

## 2021-08-30 DIAGNOSIS — F902 Attention-deficit hyperactivity disorder, combined type: Secondary | ICD-10-CM | POA: Diagnosis not present

## 2021-08-30 DIAGNOSIS — F339 Major depressive disorder, recurrent, unspecified: Secondary | ICD-10-CM

## 2021-08-30 MED ORDER — AMPHETAMINE-DEXTROAMPHETAMINE 20 MG PO TABS: 20.0000 mg | ORAL_TABLET | Freq: Three times a day (TID) | ORAL | 0 refills | Status: DC

## 2021-08-30 MED ORDER — AMPHETAMINE-DEXTROAMPHETAMINE 20 MG PO TABS: 20.0000 mg | ORAL_TABLET | Freq: Three times a day (TID) | ORAL | 0 refills | Status: DC | PRN

## 2021-08-30 MED ORDER — VENLAFAXINE HCL 75 MG PO TABS: 150.0000 mg | ORAL_TABLET | Freq: Every day | ORAL | 1 refills | Status: DC

## 2021-08-30 NOTE — Telephone Encounter (Signed)
Please advise.  Patient states her pharmacy will not refill her adderall until 09/08/2021. Is requesting an emergency refill request to last her from 10/23-10/27

## 2021-08-30 NOTE — Progress Notes (Signed)
Subjective  CC:  Chief Complaint  Patient presents with   Depression   ADHD    HPI: Natasha Mcclain is a 55 y.o. female who presents to the office today to address the problems listed above in the chief complaint.  Patient is here today for follow up of ADD/ADHD. She is taking medication as directed and continues to feel it is beneficial. The medications continue to help with focus and attention and task completion. She denies adverse side effects; specifically no headaches, appetite suppression, weight loss, sleeping difficulty, heart palpitations, chest pain or significant weight changes.she needs a third daytime dose due to her extended work day. No concerns for effects on sleep.   Depression f/u: last several months she was having more depressive sxs: multiple reasons including chronic back pain and other things. She doubled her dose of effexor about a month ago and has had a very positive response. Feels back to herself. No Aes    Assessment  1. Attention deficit hyperactivity disorder (ADHD), combined type   2. Major depression, recurrent, chronic (HCC)      Plan  ADD:  increase adderall 20 tid. She prefers this rather than use a long acting formulation.  Depression: now controlled on high dose effexor. Continue for 6 months and then reevaluate.   Follow up: Return for as scheduled. Recheck add on new dose.  No orders of the defined types were placed in this encounter.  Meds ordered this encounter  Medications   DISCONTD: amphetamine-dextroamphetamine (ADDERALL) 20 MG tablet    Sig: Take 1 tablet (20 mg total) by mouth 3 (three) times daily as needed.    Dispense:  90 tablet    Refill:  0    Dose increase   venlafaxine (EFFEXOR) 75 MG tablet    Sig: Take 2 tablets (150 mg total) by mouth daily.    Dispense:  180 tablet    Refill:  1    Dose increase      I reviewed the patients updated PMH, FH, and SocHx.    Patient Active Problem List   Diagnosis Date Noted    Chronic prescription benzodiazepine use 12/29/2019    Priority: 1.   Attention deficit hyperactivity disorder (ADHD), combined type 01/13/2019    Priority: 1.   History of left breast cancer 2009 08/13/2013    Priority: 1.   Major depression, recurrent, chronic ([place]) 07/06/2011    Priority: 1.   Family history of colon cancer 12/29/2019    Priority: 2.   Muscle spasms of neck 01/13/2019    Priority: 2.   Allergic conjunctivitis 12/29/2019    Priority: 3.   Seasonal allergies 01/13/2019    Priority: 3.   Ovarian cyst 01/06/2011    Priority: 3.   Drug reaction 12/17/2020   Perimenopausal 12/29/2019   Current Meds  Medication Sig   Cetirizine HCl 10 MG CAPS Take 1 capsule (10 mg total) by mouth daily.   diazepam (VALIUM) 10 MG tablet Take 1 tablet (10 mg total) by mouth at bedtime as needed (muscle spasm).   fluticasone (FLONASE) 50 MCG/ACT nasal spray SPRAY 1 SPRAY INTO BOTH NOSTRILS DAILY.   gabapentin (NEURONTIN) 100 MG capsule TAKE 1-3 CAPSULES BY MOUTH AT BEDTIME   Olopatadine HCl 0.2 % SOLN Gets OTC now   [DISCONTINUED] amphetamine-dextroamphetamine (ADDERALL) 20 MG tablet Take 1 tablet (20 mg total) by mouth 2 (two) times daily.   [DISCONTINUED] venlafaxine (EFFEXOR) 75 MG tablet TAKE 1 TABLET BY MOUTH EVERY DAY  Allergies: Patient is allergic to paclitaxel, other, short ragweed pollen ext, tramadol, and tape. Family History: Patient family history includes Breast cancer in her mother; Cancer in her sister; Colon cancer in her cousin; Diabetes in her brother, mother, and sister; Early death in her father and mother; Hypertension in her sister; Miscarriages / Stillbirths in her mother. Social History:  Patient  reports that she has never smoked. She has never used smokeless tobacco. She reports current alcohol use. She reports that she does not currently use drugs.  Review of Systems: Constitutional: Negative for fever malaise or anorexia Cardiovascular: negative for  chest pain Respiratory: negative for SOB or persistent cough Gastrointestinal: negative for abdominal pain  Objective  Vitals: BP 118/80   Pulse 91   Temp 98 F (36.7 C) (Temporal)   Ht 5\' 9"  (1.753 m)   Wt 158 lb 3.2 oz (71.8 kg)   SpO2 99%   BMI 23.36 kg/m  General: no acute distress , A&Ox3 Psych: nl affect   Commons side effects, risks, benefits, and alternatives for medications and treatment plan prescribed today were discussed, and the patient expressed understanding of the given instructions. Patient is instructed to call or message via MyChart if he/she has any questions or concerns regarding our treatment plan. No barriers to understanding were identified. We discussed Red Flag symptoms and signs in detail. Patient expressed understanding regarding what to do in case of urgent or emergency type symptoms.  Medication list was reconciled, printed and provided to the patient in AVS. Patient instructions and summary information was reviewed with the patient as documented in the AVS. This note was prepared with assistance of Dragon voice recognition software. Occasional wrong-word or sound-a-like substitutions may have occurred due to the inherent limitations of voice recognition software

## 2021-08-30 NOTE — Telephone Encounter (Signed)
Pt called stating that she saw Dr Jonni Sanger today and needs a refill of her Adderall. She stated that the nurse told her to call the office once she counted how many pills she has left. Natasha Mcclain stated that she would run out on 10/23. Please Advise.

## 2021-08-30 NOTE — Patient Instructions (Signed)
Please follow up as scheduled for your next visit with me: 09/26/2021   If you have any questions or concerns, please don't hesitate to send me a message via MyChart or call the office at [phone]. Thank you for visiting with Natasha Mcclain today! It's our pleasure caring for you.

## 2021-09-02 ENCOUNTER — Encounter: Payer: Self-pay | Admitting: Family Medicine

## 2021-09-02 ENCOUNTER — Other Ambulatory Visit: Payer: Self-pay

## 2021-09-02 MED ORDER — FLUTICASONE PROPIONATE 50 MCG/ACT NA SUSP: NASAL | 2 refills | Status: DC

## 2021-09-02 NOTE — Telephone Encounter (Signed)
Spoke with patient gave a verbal understanding. Was not aware medication was sent in

## 2021-09-04 ENCOUNTER — Other Ambulatory Visit: Payer: Self-pay | Admitting: Family Medicine

## 2021-09-04 DIAGNOSIS — S82892D Other fracture of left lower leg, subsequent encounter for closed fracture with routine healing: Secondary | ICD-10-CM

## 2021-09-04 DIAGNOSIS — M25572 Pain in left ankle and joints of left foot: Secondary | ICD-10-CM

## 2021-09-05 ENCOUNTER — Ambulatory Visit: Payer: BC Managed Care – PPO | Admitting: Family Medicine

## 2021-09-26 ENCOUNTER — Ambulatory Visit (INDEPENDENT_AMBULATORY_CARE_PROVIDER_SITE_OTHER): Payer: BC Managed Care – PPO | Admitting: Family Medicine

## 2021-09-26 ENCOUNTER — Encounter: Payer: Self-pay | Admitting: Family Medicine

## 2021-09-26 ENCOUNTER — Other Ambulatory Visit: Payer: Self-pay

## 2021-09-26 VITALS — BP 110/78 | HR 91 | Temp 97.7°F | Ht 69.0 in | Wt 160.0 lb

## 2021-09-26 DIAGNOSIS — Z0000 Encounter for general adult medical examination without abnormal findings: Secondary | ICD-10-CM

## 2021-09-26 DIAGNOSIS — Z853 Personal history of malignant neoplasm of breast: Secondary | ICD-10-CM | POA: Diagnosis not present

## 2021-09-26 DIAGNOSIS — Z79899 Other long term (current) drug therapy: Secondary | ICD-10-CM

## 2021-09-26 DIAGNOSIS — Z23 Encounter for immunization: Secondary | ICD-10-CM | POA: Diagnosis not present

## 2021-09-26 DIAGNOSIS — K635 Polyp of colon: Secondary | ICD-10-CM | POA: Insufficient documentation

## 2021-09-26 DIAGNOSIS — F902 Attention-deficit hyperactivity disorder, combined type: Secondary | ICD-10-CM

## 2021-09-26 DIAGNOSIS — M62838 Other muscle spasm: Secondary | ICD-10-CM

## 2021-09-26 DIAGNOSIS — Z800 Family history of malignant neoplasm of digestive organs: Secondary | ICD-10-CM | POA: Diagnosis not present

## 2021-09-26 LAB — COMPREHENSIVE METABOLIC PANEL
ALT: 11 U/L (ref 0–35)
AST: 15 U/L (ref 0–37)
Albumin: 4.5 g/dL (ref 3.5–5.2)
Alkaline Phosphatase: 105 U/L (ref 39–117)
BUN: 9 mg/dL (ref 6–23)
CO2: 30 mEq/L (ref 19–32)
Calcium: 9.9 mg/dL (ref 8.4–10.5)
Chloride: 100 mEq/L (ref 96–112)
Creatinine, Ser: 0.63 mg/dL (ref 0.40–1.20)
GFR: 101.02 mL/min (ref >60.00)
Glucose, Bld: 109 mg/dL — ABNORMAL HIGH (ref 70–99)
Potassium: 4.3 mEq/L (ref 3.5–5.1)
Sodium: 136 mEq/L (ref 135–145)
Total Bilirubin: 0.4 mg/dL (ref 0.2–1.2)
Total Protein: 7.5 g/dL (ref 6.0–8.3)

## 2021-09-26 LAB — LIPID PANEL
Cholesterol: 191 mg/dL (ref 0–200)
HDL: 64.3 mg/dL (ref >39.00)
LDL Cholesterol: 112 mg/dL — ABNORMAL HIGH (ref 0–99)
NonHDL: 126.36
Total CHOL/HDL Ratio: 3
Triglycerides: 73 mg/dL (ref 0.0–149.0)
VLDL: 14.6 mg/dL (ref 0.0–40.0)

## 2021-09-26 LAB — CBC WITH DIFFERENTIAL/PLATELET
Basophils Absolute: 0 10*3/uL (ref 0.0–0.1)
Basophils Relative: 0.4 % (ref 0.0–3.0)
Eosinophils Absolute: 0 10*3/uL (ref 0.0–0.7)
Eosinophils Relative: 0.3 % (ref 0.0–5.0)
HCT: 41.1 % (ref 36.0–46.0)
Hemoglobin: 14 g/dL (ref 12.0–15.0)
Lymphocytes Relative: 15.4 % (ref 12.0–46.0)
Lymphs Abs: 1.3 10*3/uL (ref 0.7–4.0)
MCHC: 34.2 g/dL (ref 30.0–36.0)
MCV: 86.5 fl (ref 78.0–100.0)
Monocytes Absolute: 0.4 10*3/uL (ref 0.1–1.0)
Monocytes Relative: 5 % (ref 3.0–12.0)
Neutro Abs: 6.5 10*3/uL (ref 1.4–7.7)
Neutrophils Relative %: 78.9 % — ABNORMAL HIGH (ref 43.0–77.0)
Platelets: 276 10*3/uL (ref 150.0–400.0)
RBC: 4.75 Mil/uL (ref 3.87–5.11)
RDW: 12.8 % (ref 11.5–15.5)
WBC: 8.2 10*3/uL (ref 4.0–10.5)

## 2021-09-26 LAB — TSH: TSH: 1.37 u[IU]/mL (ref 0.35–5.50)

## 2021-09-26 MED ORDER — AMPHETAMINE-DEXTROAMPHETAMINE 20 MG PO TABS: 20.0000 mg | ORAL_TABLET | Freq: Three times a day (TID) | ORAL | 0 refills | Status: DC | PRN

## 2021-09-26 NOTE — Progress Notes (Signed)
Subjective  Chief Complaint  Patient presents with   Annual Exam    Fasting   ADHD   Depression    HPI: Natasha Mcclain is a 55 y.o. female who presents to [place] at [facility] today for a Female Wellness Visit. She also has the concerns and/or needs as listed above in the chief complaint. These will be addressed in addition to the Health Maintenance Visit.   Wellness Visit: annual visit with health maintenance review and exam without Pap  Health Maintenance  Topic Date Due   Pneumococcal Vaccine 85-20 Years old (1 - PCV) Never done   COVID-19 Vaccine (3 - Booster for [employer] series) 01/06/2021   INFLUENZA VACCINE  06/13/2021   MAMMOGRAM  07/26/2022   PAP SMEAR-Modifier  07/02/2023   COLONOSCOPY (Pts 45-45yr Insurance coverage will need to be confirmed)  01/20/2024   TETANUS/TDAP  10/01/2028   Hepatitis C Screening  Completed   HIV Screening  Completed   Zoster Vaccines- Shingrix  Completed   HPV VACCINES  Aged Out    Chronic disease f/u and/or acute problem visit: (deemed necessary to be done in addition to the wellness visit): ADD: Well last visit we increased her Adderall doses to 3 times daily to be used for the extra long workdays.  She feels this is working well.  No adverse effects.  She is due for refills.  I reviewed her [place] database.  It is appropriate. Chronic neck pain and spasms: Well managed with intermittent Valium and Neurontin. History of left breast cancer: Screens are up-to-date Family history of colon cancer: Colonoscopy is up-to-date.  Reviewed colonoscopy from 2020.  Colon polyps.  Due to family history of polyps, needs rescreening 3 to 5 years.  Assessment  1. Annual physical exam   2. History of left breast cancer 2009   3. Chronic prescription benzodiazepine use   4. Family history of colon cancer   5. Muscle spasms of neck   6. Attention deficit hyperactivity disorder (ADHD), combined type   7. Polyp of colon,  unspecified part of colon, unspecified type      Plan  Female Wellness Visit: Age appropriate Health Maintenance and Prevention measures were discussed with patient. Included topics are cancer screening recommendations, ways to keep healthy (see AVS) including dietary and exercise recommendations, regular eye and dental care, use of seat belts, and avoidance of moderate alcohol use and tobacco use.  Screens are up-to-date BMI: discussed patient's BMI and encouraged positive lifestyle modifications to help get to or maintain a target BMI. HM needs and immunizations were addressed and ordered. See below for orders. See HM and immunization section for updates.  First Shingrix given today.  Patient will return for flu vaccine and second Shingrix vaccination when due Routine labs and screening tests ordered including cmp, cbc and lipids where appropriate. Discussed recommendations regarding Vit D and calcium supplementation (see AVS)  Chronic disease management visit and/or acute problem visit: Colon polyp: Family history of colon cancer.  Will monitor for next colonoscopy when due. ADD: Doing better on current dose as needed Adderall 20 mg.  Refill medications for the next 3 months.  Counseling done. Muscle spasms and neck pain: Neurontin and benzodiazepine.  Stable  Follow up: 3 months for ADD recheck Orders Placed This Encounter  Procedures   Varicella-zoster vaccine IM (Shingrix)   CBC with Differential/Platelet   Comprehensive metabolic panel   Lipid panel   TSH   Meds ordered this encounter  Medications   amphetamine-dextroamphetamine (ADDERALL) 20 MG tablet    Sig: Take 1 tablet (20 mg total) by mouth 3 (three) times daily as needed.    Dispense:  90 tablet    Refill:  0    Please allow to fill on 11/22 due to travel for the [place] holiday.   amphetamine-dextroamphetamine (ADDERALL) 20 MG tablet    Sig: Take 1 tablet (20 mg total) by mouth 3 (three) times daily as needed.     Dispense:  90 tablet    Refill:  0   amphetamine-dextroamphetamine (ADDERALL) 20 MG tablet    Sig: Take 1 tablet (20 mg total) by mouth 3 (three) times daily as needed.    Dispense:  90 tablet    Refill:  0      Body mass index is 23.63 kg/m. Wt Readings from Last 3 Encounters:  09/26/21 160 lb (72.6 kg)  08/30/21 158 lb 3.2 oz (71.8 kg)  03/30/21 159 lb 9.6 oz (72.4 kg)     Patient Active Problem List   Diagnosis Date Noted   Chronic prescription benzodiazepine use 12/29/2019    Priority: High    Prescribed by prior pcp for neck muscle spasma    Attention deficit hyperactivity disorder (ADHD), combined type 01/13/2019    Priority: High   History of left breast cancer 2009 08/13/2013    Priority: High    2009, mastectomy left, chemo and radiation. [facility] manages.  Negative BRCA testing; mother passed from breast cancer Sister with breast cancer 51    Major depression, recurrent, chronic ([place]) 07/06/2011    Priority: High    Started with dx of breast cancer    Family history of colon cancer 12/29/2019    Priority: Medium     1st cousin father's side, 43, female 1st cousin father's side, 64s, female    Muscle spasms of neck 01/13/2019    Priority: Medium    Allergic conjunctivitis 12/29/2019    Priority: Low   Seasonal allergies 01/13/2019    Priority: Low   Ovarian cyst 01/06/2011    Priority: Low   Colon polyps 09/26/2021   Drug reaction 12/17/2020   Health Maintenance  Topic Date Due   Pneumococcal Vaccine 68-62 Years old (1 - PCV) Never done   COVID-19 Vaccine (3 - Booster for Janssen series) 01/06/2021   INFLUENZA VACCINE  06/13/2021   MAMMOGRAM  07/26/2022   PAP SMEAR-Modifier  07/02/2023   COLONOSCOPY (Pts 45-56yr Insurance coverage will need to be confirmed)  01/20/2024   TETANUS/TDAP  10/01/2028   Hepatitis C Screening  Completed   HIV Screening  Completed   Zoster Vaccines- Shingrix  Completed   HPV VACCINES  Aged Out   Immunization History   Administered Date(s) Administered   Influenza Whole 07/30/2019   Influenza, Seasonal, Injecte, Preservative Fre 08/30/2008   Influenza,inj,Quad PF,6+ Mos 10/01/2018, 07/30/2019   Influenza-Unspecified 09/22/2020   Janssen (J&J) SARS-COV-2 Vaccination 05/10/2020, 11/11/2020   Tdap 10/01/2018   Zoster Recombinat (Shingrix) 03/30/2021, 09/26/2021   We updated and reviewed the patient's past history in detail and it is documented below. Allergies: Patient is allergic to paclitaxel, other, short ragweed pollen ext, tramadol, and tape. Past Medical History Patient  has a past medical history of Allergic conjunctivitis (12/29/2019), Allergy, Arthritis, Breast cancer ([place], Depression, Eczema, Family history of colon cancer (12/29/2019), and Genital warts. Past Surgical History Patient  has a past surgical history that includes Breast biopsy (2009); Mastectomy (Left); Breast reconstruction (Left, 2010); and breast reduction  on right. Family History: Patient family history includes Breast cancer in her mother; Cancer in her sister; Colon cancer in her cousin; Diabetes in her brother, mother, and sister; Early death in her father and mother; Hypertension in her sister; Miscarriages / Stillbirths in her mother. Social History:  Patient  reports that she has never smoked. She has never used smokeless tobacco. She reports current alcohol use. She reports that she does not currently use drugs.  Review of Systems: Constitutional: negative for fever or malaise Ophthalmic: negative for photophobia, double vision or loss of vision Cardiovascular: negative for chest pain, dyspnea on exertion, or new LE swelling Respiratory: negative for SOB or persistent cough Gastrointestinal: negative for abdominal pain, change in bowel habits or melena Genitourinary: negative for dysuria or gross hematuria, no abnormal uterine bleeding or disharge Musculoskeletal: negative for new gait disturbance or muscular  weakness Integumentary: negative for new or persistent rashes, no breast lumps Neurological: negative for TIA or stroke symptoms Psychiatric: negative for SI or delusions Allergic/Immunologic: negative for hives  Patient Care Team    Relationship Specialty Notifications Start End  [name], MD PCP - General Family Medicine  12/29/19     Objective  Vitals: BP 110/78   Pulse 91   Temp 97.7 F (36.5 C) (Temporal)   Ht 5' 9"  (1.753 m)   Wt 160 lb (72.6 kg)   SpO2 97%   BMI 23.63 kg/m  General:  Well developed, well nourished, no acute distress  Psych:  Alert and orientedx3,normal mood and affect HEENT:  Normocephalic, atraumatic, non-icteric sclera,  supple neck without adenopathy, mass or thyromegaly Cardiovascular:  Normal S1, S2, RRR without gallop, rub or murmur Respiratory:  Good breath sounds bilaterally, CTAB with normal respiratory effort Gastrointestinal: normal bowel sounds, soft, non-tender, no noted masses. No HSM MSK: no deformities, contusions. Joints are without erythema or swelling.  Skin:  Warm, no rashes or suspicious lesions noted Neurologic:    Mental status is normal. CN 2-11 are normal. Gross motor and sensory exams are normal. Normal gait. No tremor   Commons side effects, risks, benefits, and alternatives for medications and treatment plan prescribed today were discussed, and the patient expressed understanding of the given instructions. Patient is instructed to call or message via MyChart if he/she has any questions or concerns regarding our treatment plan. No barriers to understanding were identified. We discussed Red Flag symptoms and signs in detail. Patient expressed understanding regarding what to do in case of urgent or emergency type symptoms.  Medication list was reconciled, printed and provided to the patient in AVS. Patient instructions and summary information was reviewed with the patient as documented in the AVS. This note was prepared with  assistance of Dragon voice recognition software. Occasional wrong-word or sound-a-like substitutions may have occurred due to the inherent limitations of voice recognition software  This visit occurred during the SARS-CoV-2 public health emergency.  Safety protocols were in place, including screening questions prior to the visit, additional usage of staff PPE, and extensive cleaning of exam room while observing appropriate contact time as indicated for disinfecting solutions.

## 2021-09-26 NOTE — Patient Instructions (Addendum)
Please return in 3 months for ADD recheck. Schedule a nurse visit for your flu and Shingrix vaccinations for later this week.   I have sent in Rxs for adderall for the next 3 months. I've asked the pharmacy to fill your next on 11/22.   I will release your lab results to you on your MyChart account with further instructions. Please reply with any questions.    If you have any questions or concerns, please don't hesitate to send me a message via MyChart or call the office at [phone]. Thank you for visiting with [place] today! It's our pleasure caring for you.

## 2021-10-04 ENCOUNTER — Other Ambulatory Visit: Payer: Self-pay | Admitting: Family Medicine

## 2021-10-04 DIAGNOSIS — M62838 Other muscle spasm: Secondary | ICD-10-CM

## 2021-10-04 NOTE — Telephone Encounter (Signed)
Last OV 09/26/2021 dx annual physical exam Last Refill 03/30/2021

## 2022-01-04 ENCOUNTER — Encounter: Payer: Self-pay | Admitting: Family Medicine

## 2022-01-04 ENCOUNTER — Other Ambulatory Visit: Payer: Self-pay

## 2022-01-04 DIAGNOSIS — F902 Attention-deficit hyperactivity disorder, combined type: Secondary | ICD-10-CM

## 2022-01-04 DIAGNOSIS — M62838 Other muscle spasm: Secondary | ICD-10-CM

## 2022-01-04 MED ORDER — DIAZEPAM 10 MG PO TABS: ORAL_TABLET | ORAL | 1 refills | Status: DC

## 2022-01-04 NOTE — Telephone Encounter (Signed)
She needs to schedule an appointment.  Algis Greenhouse. Jerline Pain, MD 01/04/2022 11:44 AM

## 2022-01-09 ENCOUNTER — Ambulatory Visit (INDEPENDENT_AMBULATORY_CARE_PROVIDER_SITE_OTHER): Payer: BC Managed Care – PPO | Admitting: Family Medicine

## 2022-01-09 ENCOUNTER — Other Ambulatory Visit: Payer: Self-pay

## 2022-01-09 ENCOUNTER — Encounter: Payer: Self-pay | Admitting: Family Medicine

## 2022-01-09 VITALS — BP 130/92 | HR 93 | Temp 98.0°F | Ht 69.0 in | Wt 156.0 lb

## 2022-01-09 DIAGNOSIS — F902 Attention-deficit hyperactivity disorder, combined type: Secondary | ICD-10-CM | POA: Diagnosis not present

## 2022-01-09 DIAGNOSIS — Z79899 Other long term (current) drug therapy: Secondary | ICD-10-CM

## 2022-01-09 DIAGNOSIS — F339 Major depressive disorder, recurrent, unspecified: Secondary | ICD-10-CM

## 2022-01-09 DIAGNOSIS — N939 Abnormal uterine and vaginal bleeding, unspecified: Secondary | ICD-10-CM | POA: Diagnosis not present

## 2022-01-09 DIAGNOSIS — M62838 Other muscle spasm: Secondary | ICD-10-CM

## 2022-01-09 DIAGNOSIS — Z853 Personal history of malignant neoplasm of breast: Secondary | ICD-10-CM

## 2022-01-09 MED ORDER — AMPHETAMINE-DEXTROAMPHETAMINE 20 MG PO TABS: 20.0000 mg | ORAL_TABLET | Freq: Three times a day (TID) | ORAL | 0 refills | Status: DC | PRN

## 2022-01-09 MED ORDER — GABAPENTIN 100 MG PO CAPS: 100.0000 mg | ORAL_CAPSULE | Freq: Every day | ORAL | 3 refills | Status: DC

## 2022-01-09 NOTE — Progress Notes (Signed)
Subjective  CC:  Chief Complaint  Patient presents with   ADHD    HPI: Natasha Mcclain is a 56 y.o. female who presents to the office today to address the problems listed above in the chief complaint.  Patient is here today for follow up of ADD/ADHD. She is taking medication as directed and continues to feel it is beneficial. The medications continue to help with focus and attention and task completion. She denies adverse side effects; specifically no headaches, appetite suppression, weight loss, sleeping difficulty, heart palpitations, chest pain or significant weight changes.  This patient does not have contraindications for stimulant use include hypertension, tachycardia, arrhythmia, psychosis, bipolar disorder, severe anorexia, and Tourette syndrome.  Chronic Valium use for cervical muscle spasms.  She ran out a few days early because she went through a hard time due to her sisters medical problems back in December.  She continues on gabapentin nightly varying doses between 103 100 mg depending on her pain.  She feels her sleep is good.  This medications are helpful.  She requests refills.  She does report 2 episodes of spotting monthly over the last 2 months.  Spotting only lasting day.  She did have symptoms of menstrual cycles including bloating and hormonal changes.  I reviewed notes.  She did have an [place] that was elevated at 55 in 2021.  Her last regular.  Was May 2020.  She declines further evaluation for this today.  Assessment  1. Attention deficit hyperactivity disorder (ADHD), combined type   2. Chronic prescription benzodiazepine use   3. Major depression, recurrent, chronic (HCC) Chronic  4. Vaginal spotting   5. History of left breast cancer 2009   6. Muscle spasms of neck      Plan  ADD: Well-controlled.I reviewed patient's records from the PMP aware controlled substance registry today.  Refill medications today, Adderall 20 mg 3 times daily as needed.  42-month  prescription sent in. Valium has another refill left on file.  Patient to fill.  Education given.  Working well for muscle spasms.  Continue gabapentin. Need to monitor vaginal spotting.  May want blood work and endometrial biopsy.  Patient defers both today.  She does have a GYN oncologist.  Follow up: 3 months for ED follow-up No orders of the defined types were placed in this encounter.  Meds ordered this encounter  Medications   amphetamine-dextroamphetamine (ADDERALL) 20 MG tablet    Sig: Take 1 tablet (20 mg total) by mouth 3 (three) times daily as needed.    Dispense:  90 tablet    Refill:  0   gabapentin (NEURONTIN) 100 MG capsule    Sig: Take 1-3 capsules (100-300 mg total) by mouth at bedtime.    Dispense:  270 capsule    Refill:  3   amphetamine-dextroamphetamine (ADDERALL) 20 MG tablet    Sig: Take 1 tablet (20 mg total) by mouth 3 (three) times daily as needed.    Dispense:  90 tablet    Refill:  0   amphetamine-dextroamphetamine (ADDERALL) 20 MG tablet    Sig: Take 1 tablet (20 mg total) by mouth 3 (three) times daily as needed.    Dispense:  90 tablet    Refill:  0      I reviewed the patients updated PMH, FH, and SocHx.    Patient Active Problem List   Diagnosis Date Noted   Chronic prescription benzodiazepine use 12/29/2019    Priority: High   Attention deficit hyperactivity  disorder (ADHD), combined type 01/13/2019    Priority: High   History of left breast cancer 2009 08/13/2013    Priority: High   Major depression, recurrent, chronic ([place]) 07/06/2011    Priority: High   Family history of colon cancer 12/29/2019    Priority: Medium    Muscle spasms of neck 01/13/2019    Priority: Medium    Allergic conjunctivitis 12/29/2019    Priority: Low   Seasonal allergies 01/13/2019    Priority: Low   Ovarian cyst 01/06/2011    Priority: Low   Colon polyps 09/26/2021   Drug reaction 12/17/2020   Current Meds  Medication Sig   Cetirizine HCl 10 MG CAPS  Take 1 capsule (10 mg total) by mouth daily.   diazepam (VALIUM) 10 MG tablet TAKE 1 TABLET BY MOUTH AT BEDTIME AS NEEDED (MUSCLE SPASM).   fluticasone (FLONASE) 50 MCG/ACT nasal spray SPRAY 1 SPRAY INTO BOTH NOSTRILS DAILY.   Olopatadine HCl 0.2 % SOLN Gets OTC now   venlafaxine (EFFEXOR) 75 MG tablet Take 2 tablets (150 mg total) by mouth daily.   [DISCONTINUED] amphetamine-dextroamphetamine (ADDERALL) 20 MG tablet Take 1 tablet (20 mg total) by mouth 3 (three) times daily as needed.   [DISCONTINUED] amphetamine-dextroamphetamine (ADDERALL) 20 MG tablet Take 1 tablet (20 mg total) by mouth 3 (three) times daily as needed.   [DISCONTINUED] amphetamine-dextroamphetamine (ADDERALL) 20 MG tablet Take 1 tablet (20 mg total) by mouth 3 (three) times daily as needed.   [DISCONTINUED] gabapentin (NEURONTIN) 100 MG capsule TAKE 1-3 CAPSULES BY MOUTH AT BEDTIME    Allergies: Patient is allergic to paclitaxel, other, short ragweed pollen ext, tramadol, and tape. Family History: Patient family history includes Breast cancer in her mother; Cancer in her sister; Colon cancer in her cousin; Diabetes in her brother, mother, and sister; Early death in her father and mother; Hypertension in her sister; Miscarriages / Stillbirths in her mother. Social History:  Patient  reports that she has never smoked. She has never used smokeless tobacco. She reports current alcohol use. She reports that she does not currently use drugs.  Review of Systems: Constitutional: Negative for fever malaise or anorexia Cardiovascular: negative for chest pain Respiratory: negative for SOB or persistent cough Gastrointestinal: negative for abdominal pain  Objective  Vitals: BP (!) 130/92    Pulse 93    Temp 98 F (36.7 C) (Temporal)    Ht 5\' 9"  (1.753 m)    Wt 156 lb (70.8 kg)    SpO2 98%    BMI 23.04 kg/m  General: no acute distress , A&Ox3, appears well   Commons side effects, risks, benefits, and alternatives for  medications and treatment plan prescribed today were discussed, and the patient expressed understanding of the given instructions. Patient is instructed to call or message via MyChart if he/she has any questions or concerns regarding our treatment plan. No barriers to understanding were identified. We discussed Red Flag symptoms and signs in detail. Patient expressed understanding regarding what to do in case of urgent or emergency type symptoms.  Medication list was reconciled, printed and provided to the patient in AVS. Patient instructions and summary information was reviewed with the patient as documented in the AVS. This note was prepared with assistance of Dragon voice recognition software. Occasional wrong-word or sound-a-like substitutions may have occurred due to the inherent limitations of voice recognition software

## 2022-01-09 NOTE — Patient Instructions (Signed)
Please return in 3 months for ADD recheck and monitor hormones if needed.  If you have any questions or concerns, please don't hesitate to send me a message via MyChart or call the office at [phone]. Thank you for visiting with [place] today! It's our pleasure caring for you.

## 2022-02-27 ENCOUNTER — Other Ambulatory Visit: Payer: Self-pay | Admitting: Family Medicine

## 2022-03-29 ENCOUNTER — Encounter: Payer: Self-pay | Admitting: Family Medicine

## 2022-03-30 ENCOUNTER — Encounter: Payer: Self-pay | Admitting: Family Medicine

## 2022-03-30 ENCOUNTER — Ambulatory Visit (INDEPENDENT_AMBULATORY_CARE_PROVIDER_SITE_OTHER): Payer: BC Managed Care – PPO | Admitting: Family Medicine

## 2022-03-30 VITALS — BP 118/78 | HR 94 | Temp 97.8°F | Ht 69.0 in | Wt 158.6 lb

## 2022-03-30 DIAGNOSIS — N912 Amenorrhea, unspecified: Secondary | ICD-10-CM

## 2022-03-30 DIAGNOSIS — F902 Attention-deficit hyperactivity disorder, combined type: Secondary | ICD-10-CM | POA: Diagnosis not present

## 2022-03-30 DIAGNOSIS — M62838 Other muscle spasm: Secondary | ICD-10-CM | POA: Diagnosis not present

## 2022-03-30 DIAGNOSIS — M47816 Spondylosis without myelopathy or radiculopathy, lumbar region: Secondary | ICD-10-CM | POA: Insufficient documentation

## 2022-03-30 DIAGNOSIS — M47812 Spondylosis without myelopathy or radiculopathy, cervical region: Secondary | ICD-10-CM | POA: Insufficient documentation

## 2022-03-30 LAB — FOLLICLE STIMULATING HORMONE: FSH: 60.2 m[IU]/mL

## 2022-03-30 MED ORDER — AMPHETAMINE-DEXTROAMPHETAMINE 20 MG PO TABS: 20.0000 mg | ORAL_TABLET | Freq: Three times a day (TID) | ORAL | 0 refills | Status: DC | PRN

## 2022-03-30 MED ORDER — TERBINAFINE HCL 250 MG PO TABS: 250.0000 mg | ORAL_TABLET | Freq: Every day | ORAL | 2 refills | Status: DC

## 2022-03-30 NOTE — Patient Instructions (Signed)
Please return in 6 months for your annual complete physical; please come fasting.   I will release your lab results to you on your MyChart account with further instructions. You may see the results before I do, but when I review them I will send you a message with my report or have my assistant call you if things need to be discussed. Please reply to my message with any questions. Thank you!   Please send a mychart message when you need your adderall refilled in 3 months. I have sent in RX for may, June and July.   If you have any questions or concerns, please don't hesitate to send me a message via MyChart or call the office at [phone]. Thank you for visiting with [place] today! It's our pleasure caring for you.

## 2022-03-30 NOTE — Progress Notes (Signed)
Subjective  CC:  Chief Complaint  Patient presents with   ADHD    Pt here for ADD check and mb hormonal check    HPI: Natasha Mcclain is a 56 y.o. female who presents to the office today to address the problems listed above in the chief complaint.  Patient is here today for follow up of ADD/ADHD.reports it is stable.  Brings in [place] of cervical and lumbar spine: see mychart note: mild thoracic scoliosis and diffuse djd. Stable on gabapentin and valium Requests hormone check. I reviewed last note. Had elevated fsh of 55 in 2021. Last menses 2021. Still feels cyclic sxs. No more vaginal bleeding.   Assessment  1. Attention deficit hyperactivity disorder (ADHD), combined type   2. Amenorrhea   3. Muscle spasms of neck      Plan  ADD:  refilled adderall tid. Stable Amenorrhea: pt is postmenopausal. Unclear why cyclical "hormonal" symptoms. She will f/u with gyn if needed. No bleeding Mm spasm and djd: continue meds.  Toe nail fungus: lamisil  Follow up: No follow-ups on file.  Orders Placed This Encounter  Procedures   Follicle stimulating hormone   Meds ordered this encounter  Medications   amphetamine-dextroamphetamine (ADDERALL) 20 MG tablet    Sig: Take 1 tablet (20 mg total) by mouth 3 (three) times daily as needed.    Dispense:  90 tablet    Refill:  0   amphetamine-dextroamphetamine (ADDERALL) 20 MG tablet    Sig: Take 1 tablet (20 mg total) by mouth 3 (three) times daily as needed.    Dispense:  90 tablet    Refill:  0   amphetamine-dextroamphetamine (ADDERALL) 20 MG tablet    Sig: Take 1 tablet (20 mg total) by mouth 3 (three) times daily as needed.    Dispense:  90 tablet    Refill:  0   terbinafine (LAMISIL) 250 MG tablet    Sig: Take 1 tablet (250 mg total) by mouth daily.    Dispense:  30 tablet    Refill:  2      I reviewed the patients updated PMH, FH, and SocHx.    Patient Active Problem List   Diagnosis Date Noted   Chronic prescription  benzodiazepine use 12/29/2019    Priority: High   Attention deficit hyperactivity disorder (ADHD), combined type 01/13/2019    Priority: High   History of left breast cancer 2009 08/13/2013    Priority: High   Major depression, recurrent, chronic ([place]) 07/06/2011    Priority: High   Family history of colon cancer 12/29/2019    Priority: Medium    Muscle spasms of neck 01/13/2019    Priority: Medium    Allergic conjunctivitis 12/29/2019    Priority: Low   Seasonal allergies 01/13/2019    Priority: Low   Ovarian cyst 01/06/2011    Priority: Low   DJD (degenerative joint disease) of cervical spine 03/30/2022   Degenerative joint disease (DJD) of lumbar spine 03/30/2022   Colon polyps 09/26/2021   Drug reaction 12/17/2020   Current Meds  Medication Sig   terbinafine (LAMISIL) 250 MG tablet Take 1 tablet (250 mg total) by mouth daily.    Allergies: Patient is allergic to paclitaxel, other, short ragweed pollen ext, tramadol, and tape. Family History: Patient family history includes Breast cancer in her mother; Cancer in her sister; Colon cancer in her cousin; Diabetes in her brother, mother, and sister; Early death in her father and mother; Hypertension in her  sister; Miscarriages / Stillbirths in her mother. Social History:  Patient  reports that she has never smoked. She has never used smokeless tobacco. She reports current alcohol use. She reports that she does not currently use drugs.  Review of Systems: Constitutional: Negative for fever malaise or anorexia Cardiovascular: negative for chest pain Respiratory: negative for SOB or persistent cough Gastrointestinal: negative for abdominal pain  Objective  Vitals: BP 118/78   Pulse 94   Temp 97.8 F (36.6 C)   Ht '5\' 9"'$  (1.753 m)   Wt 158 lb 9.6 oz (71.9 kg)   SpO2 96%   BMI 23.42 kg/m  General: no acute distress , A&Ox3 HEENT: [place]ER[place], conjunctiva normal, Oropharynx moist,neck is supple Cardiovascular:  RRR without  murmur or gallop.  Respiratory:  Good breath sounds bilaterally, CTAB with normal respiratory effort Skin:  fungal toenail changes   Commons side effects, risks, benefits, and alternatives for medications and treatment plan prescribed today were discussed, and the patient expressed understanding of the given instructions. Patient is instructed to call or message via MyChart if he/she has any questions or concerns regarding our treatment plan. No barriers to understanding were identified. We discussed Red Flag symptoms and signs in detail. Patient expressed understanding regarding what to do in case of urgent or emergency type symptoms.  Medication list was reconciled, printed and provided to the patient in AVS. Patient instructions and summary information was reviewed with the patient as documented in the AVS. This note was prepared with assistance of Dragon voice recognition software. Occasional wrong-word or sound-a-like substitutions may have occurred due to the inherent limitations of voice recognition software

## 2022-04-07 ENCOUNTER — Ambulatory Visit: Payer: BC Managed Care – PPO | Admitting: Family Medicine

## 2022-04-09 ENCOUNTER — Other Ambulatory Visit: Payer: Self-pay | Admitting: Family Medicine

## 2022-04-09 DIAGNOSIS — M62838 Other muscle spasm: Secondary | ICD-10-CM

## 2022-06-08 ENCOUNTER — Encounter: Payer: Self-pay | Admitting: Family Medicine

## 2022-06-08 ENCOUNTER — Other Ambulatory Visit: Payer: Self-pay | Admitting: Family Medicine

## 2022-07-10 ENCOUNTER — Other Ambulatory Visit: Payer: Self-pay | Admitting: Family Medicine

## 2022-07-10 DIAGNOSIS — F902 Attention-deficit hyperactivity disorder, combined type: Secondary | ICD-10-CM

## 2022-07-10 MED ORDER — AMPHETAMINE-DEXTROAMPHETAMINE 20 MG PO TABS: 20.0000 mg | ORAL_TABLET | Freq: Three times a day (TID) | ORAL | 0 refills | Status: DC | PRN

## 2022-07-10 NOTE — Telephone Encounter (Signed)
Request sent to provider.

## 2022-07-10 NOTE — Telephone Encounter (Signed)
  LAST APPOINTMENT DATE:  03/30/22  NEXT APPOINTMENT DATE: 10/04/22  MEDICATION: amphetamine-dextroamphetamine (ADDERALL) 20 MG tablet   Is the patient out of medication? Has a couple of doses left   PHARMACY: [id number] [place], [place] - [address] [place] Phone:  [phone] Fax:  [phone] DEA #:  [id number]  Patient states: - Pharmacy informed her that PCP can only send in 3 month supply at one time

## 2022-07-31 LAB — HM MAMMOGRAPHY

## 2022-08-01 DIAGNOSIS — C50912 Malignant neoplasm of unspecified site of left female breast: Secondary | ICD-10-CM | POA: Diagnosis not present

## 2022-08-01 DIAGNOSIS — Z1231 Encounter for screening mammogram for malignant neoplasm of breast: Secondary | ICD-10-CM | POA: Diagnosis not present

## 2022-08-01 DIAGNOSIS — Z170 Estrogen receptor positive status [ER+]: Secondary | ICD-10-CM | POA: Diagnosis not present

## 2022-08-01 DIAGNOSIS — Z6823 Body mass index (BMI) 23.0-23.9, adult: Secondary | ICD-10-CM | POA: Diagnosis not present

## 2022-08-07 ENCOUNTER — Encounter: Payer: Self-pay | Admitting: *Deleted

## 2022-08-11 ENCOUNTER — Other Ambulatory Visit: Payer: Self-pay

## 2022-08-11 ENCOUNTER — Telehealth: Payer: Self-pay | Admitting: Family Medicine

## 2022-08-11 DIAGNOSIS — F902 Attention-deficit hyperactivity disorder, combined type: Secondary | ICD-10-CM

## 2022-08-11 DIAGNOSIS — C50912 Malignant neoplasm of unspecified site of left female breast: Secondary | ICD-10-CM | POA: Diagnosis not present

## 2022-08-11 DIAGNOSIS — L7682 Other postprocedural complications of skin and subcutaneous tissue: Secondary | ICD-10-CM | POA: Diagnosis not present

## 2022-08-11 DIAGNOSIS — L905 Scar conditions and fibrosis of skin: Secondary | ICD-10-CM | POA: Diagnosis not present

## 2022-08-11 DIAGNOSIS — I972 Postmastectomy lymphedema syndrome: Secondary | ICD-10-CM | POA: Insufficient documentation

## 2022-08-11 MED ORDER — AMPHETAMINE-DEXTROAMPHETAMINE 20 MG PO TABS: 20.0000 mg | ORAL_TABLET | Freq: Three times a day (TID) | ORAL | 0 refills | Status: DC | PRN

## 2022-08-11 NOTE — Telephone Encounter (Signed)
Last refill: 07/10/22 #90, 0 Last OV: 03/30/22 dx. ADHD

## 2022-08-11 NOTE — Telephone Encounter (Signed)
.   Encourage patient to contact the pharmacy for refills or they can request refills through [facility]:  03/30/22  NEXT APPOINTMENT DATE: 10/04/22  MEDICATION: amphetamine-dextroamphetamine (ADDERALL) 20 MG tablet  Is the patient out of medication?   PH[id number] [place], [place] [address]:  [phone] Fax:  [phone]     Let patient know to contact pharmacy at the end of the day to make sure medication is ready.  Please notify patient to allow 48-72 hours to process

## 2022-08-17 DIAGNOSIS — C50912 Malignant neoplasm of unspecified site of left female breast: Secondary | ICD-10-CM | POA: Diagnosis not present

## 2022-08-17 DIAGNOSIS — L7682 Other postprocedural complications of skin and subcutaneous tissue: Secondary | ICD-10-CM | POA: Diagnosis not present

## 2022-08-17 DIAGNOSIS — L905 Scar conditions and fibrosis of skin: Secondary | ICD-10-CM | POA: Diagnosis not present

## 2022-08-17 DIAGNOSIS — I972 Postmastectomy lymphedema syndrome: Secondary | ICD-10-CM | POA: Diagnosis not present

## 2022-09-02 ENCOUNTER — Other Ambulatory Visit: Payer: Self-pay | Admitting: Family Medicine

## 2022-09-07 DIAGNOSIS — I972 Postmastectomy lymphedema syndrome: Secondary | ICD-10-CM | POA: Diagnosis not present

## 2022-09-07 DIAGNOSIS — L905 Scar conditions and fibrosis of skin: Secondary | ICD-10-CM | POA: Diagnosis not present

## 2022-09-07 DIAGNOSIS — C50912 Malignant neoplasm of unspecified site of left female breast: Secondary | ICD-10-CM | POA: Diagnosis not present

## 2022-09-07 DIAGNOSIS — L7682 Other postprocedural complications of skin and subcutaneous tissue: Secondary | ICD-10-CM | POA: Diagnosis not present

## 2022-09-08 ENCOUNTER — Other Ambulatory Visit: Payer: Self-pay | Admitting: Family Medicine

## 2022-09-08 DIAGNOSIS — F902 Attention-deficit hyperactivity disorder, combined type: Secondary | ICD-10-CM

## 2022-09-08 MED ORDER — AMPHETAMINE-DEXTROAMPHETAMINE 20 MG PO TABS: 20.0000 mg | ORAL_TABLET | Freq: Three times a day (TID) | ORAL | 0 refills | Status: DC | PRN

## 2022-09-08 NOTE — Telephone Encounter (Signed)
  LAST APPOINTMENT DATE:   03/30/22 OV with PCP   NEXT APPOINTMENT DATE: 10/04/22 CPE    MEDICATION: amphetamine-dextroamphetamine (ADDERALL) 20 MG tablet [[id number]]    Is the patient out of medication?  Has 5 days left   PHARMACY: [id number] [place], [place] - [address], [place]: [phone] Fax: [phone] #: [id number]

## 2022-09-14 DIAGNOSIS — L7682 Other postprocedural complications of skin and subcutaneous tissue: Secondary | ICD-10-CM | POA: Diagnosis not present

## 2022-09-14 DIAGNOSIS — C50912 Malignant neoplasm of unspecified site of left female breast: Secondary | ICD-10-CM | POA: Diagnosis not present

## 2022-09-14 DIAGNOSIS — I972 Postmastectomy lymphedema syndrome: Secondary | ICD-10-CM | POA: Diagnosis not present

## 2022-09-14 DIAGNOSIS — L905 Scar conditions and fibrosis of skin: Secondary | ICD-10-CM | POA: Diagnosis not present

## 2022-09-19 DIAGNOSIS — I972 Postmastectomy lymphedema syndrome: Secondary | ICD-10-CM | POA: Diagnosis not present

## 2022-09-19 DIAGNOSIS — C50912 Malignant neoplasm of unspecified site of left female breast: Secondary | ICD-10-CM | POA: Diagnosis not present

## 2022-09-19 DIAGNOSIS — L905 Scar conditions and fibrosis of skin: Secondary | ICD-10-CM | POA: Diagnosis not present

## 2022-09-19 DIAGNOSIS — L7682 Other postprocedural complications of skin and subcutaneous tissue: Secondary | ICD-10-CM | POA: Diagnosis not present

## 2022-09-28 DIAGNOSIS — L905 Scar conditions and fibrosis of skin: Secondary | ICD-10-CM | POA: Diagnosis not present

## 2022-09-28 DIAGNOSIS — L7682 Other postprocedural complications of skin and subcutaneous tissue: Secondary | ICD-10-CM | POA: Diagnosis not present

## 2022-09-28 DIAGNOSIS — I972 Postmastectomy lymphedema syndrome: Secondary | ICD-10-CM | POA: Diagnosis not present

## 2022-09-28 DIAGNOSIS — C50912 Malignant neoplasm of unspecified site of left female breast: Secondary | ICD-10-CM | POA: Diagnosis not present

## 2022-10-04 ENCOUNTER — Encounter: Payer: Self-pay | Admitting: Family Medicine

## 2022-10-04 ENCOUNTER — Ambulatory Visit (INDEPENDENT_AMBULATORY_CARE_PROVIDER_SITE_OTHER): Payer: BC Managed Care – PPO | Admitting: Family Medicine

## 2022-10-04 VITALS — BP 130/80 | HR 99 | Temp 97.6°F | Ht 69.0 in | Wt 157.4 lb

## 2022-10-04 DIAGNOSIS — M62838 Other muscle spasm: Secondary | ICD-10-CM | POA: Diagnosis not present

## 2022-10-04 DIAGNOSIS — Z79899 Other long term (current) drug therapy: Secondary | ICD-10-CM

## 2022-10-04 DIAGNOSIS — F339 Major depressive disorder, recurrent, unspecified: Secondary | ICD-10-CM | POA: Diagnosis not present

## 2022-10-04 DIAGNOSIS — Z0000 Encounter for general adult medical examination without abnormal findings: Secondary | ICD-10-CM | POA: Diagnosis not present

## 2022-10-04 DIAGNOSIS — J302 Other seasonal allergic rhinitis: Secondary | ICD-10-CM

## 2022-10-04 DIAGNOSIS — F902 Attention-deficit hyperactivity disorder, combined type: Secondary | ICD-10-CM

## 2022-10-04 DIAGNOSIS — Z853 Personal history of malignant neoplasm of breast: Secondary | ICD-10-CM | POA: Diagnosis not present

## 2022-10-04 DIAGNOSIS — G8929 Other chronic pain: Secondary | ICD-10-CM

## 2022-10-04 DIAGNOSIS — I972 Postmastectomy lymphedema syndrome: Secondary | ICD-10-CM

## 2022-10-04 LAB — TSH: TSH: 1.17 u[IU]/mL (ref 0.35–5.50)

## 2022-10-04 LAB — COMPREHENSIVE METABOLIC PANEL
ALT: 17 U/L (ref 0–35)
AST: 20 U/L (ref 0–37)
Albumin: 4.5 g/dL (ref 3.5–5.2)
Alkaline Phosphatase: 146 U/L — ABNORMAL HIGH (ref 39–117)
BUN: 7 mg/dL (ref 6–23)
CO2: 29 mEq/L (ref 19–32)
Calcium: 9.3 mg/dL (ref 8.4–10.5)
Chloride: 101 mEq/L (ref 96–112)
Creatinine, Ser: 0.53 mg/dL (ref 0.40–1.20)
GFR: 104.56 mL/min (ref >60.00)
Glucose, Bld: 116 mg/dL — ABNORMAL HIGH (ref 70–99)
Potassium: 3.9 mEq/L (ref 3.5–5.1)
Sodium: 137 mEq/L (ref 135–145)
Total Bilirubin: 0.5 mg/dL (ref 0.2–1.2)
Total Protein: 7.4 g/dL (ref 6.0–8.3)

## 2022-10-04 LAB — CBC WITH DIFFERENTIAL/PLATELET
Basophils Absolute: 0 10*3/uL (ref 0.0–0.1)
Basophils Relative: 0.3 % (ref 0.0–3.0)
Eosinophils Absolute: 0 10*3/uL (ref 0.0–0.7)
Eosinophils Relative: 0.4 % (ref 0.0–5.0)
HCT: 42.7 % (ref 36.0–46.0)
Hemoglobin: 14.4 g/dL (ref 12.0–15.0)
Lymphocytes Relative: 13.8 % (ref 12.0–46.0)
Lymphs Abs: 1.1 10*3/uL (ref 0.7–4.0)
MCHC: 33.8 g/dL (ref 30.0–36.0)
MCV: 88 fl (ref 78.0–100.0)
Monocytes Absolute: 0.5 10*3/uL (ref 0.1–1.0)
Monocytes Relative: 5.8 % (ref 3.0–12.0)
Neutro Abs: 6.3 10*3/uL (ref 1.4–7.7)
Neutrophils Relative %: 79.7 % — ABNORMAL HIGH (ref 43.0–77.0)
Platelets: 305 10*3/uL (ref 150.0–400.0)
RBC: 4.85 Mil/uL (ref 3.87–5.11)
RDW: 13.2 % (ref 11.5–15.5)
WBC: 7.9 10*3/uL (ref 4.0–10.5)

## 2022-10-04 LAB — LIPID PANEL
Cholesterol: 207 mg/dL — ABNORMAL HIGH (ref 0–200)
HDL: 84.7 mg/dL (ref >39.00)
LDL Cholesterol: 111 mg/dL — ABNORMAL HIGH (ref 0–99)
NonHDL: 122.21
Total CHOL/HDL Ratio: 2
Triglycerides: 54 mg/dL (ref 0.0–149.0)
VLDL: 10.8 mg/dL (ref 0.0–40.0)

## 2022-10-04 MED ORDER — FLUTICASONE PROPIONATE 50 MCG/ACT NA SUSP: NASAL | 2 refills | Status: DC

## 2022-10-04 MED ORDER — AMPHETAMINE-DEXTROAMPHETAMINE 20 MG PO TABS: 20.0000 mg | ORAL_TABLET | Freq: Three times a day (TID) | ORAL | 0 refills | Status: DC | PRN

## 2022-10-04 MED ORDER — DIAZEPAM 10 MG PO TABS: 10.0000 mg | ORAL_TABLET | Freq: Every evening | ORAL | 1 refills | Status: DC | PRN

## 2022-10-04 NOTE — Patient Instructions (Signed)
Please return in 6 months for ADD  I will release your lab results to you on your MyChart account with further instructions. You may see the results before I do, but when I review them I will send you a message with my report or have my assistant call you if things need to be discussed. Please reply to my message with any questions. Thank you!   Mychart me when due for your ADD medications in 3 months. I have sent in 3 months of RX today.   If you have any questions or concerns, please don't hesitate to send me a message via MyChart or call the office at [phone]. Thank you for visiting with [place] today! It's our pleasure caring for you.   Please do these things to maintain good health!  Exercise at least 30-45 minutes a day,  4-5 days a week.  Eat a low-fat diet with lots of fruits and vegetables, up to 7-9 servings per day. Drink plenty of water daily. Try to drink 8 8oz glasses per day. Seatbelts can save your life. Always wear your seatbelt. Place Smoke Detectors on every level of your home and check batteries every year. Schedule an appointment with an eye doctor for an eye exam every 1-2 years Safe sex - use condoms to protect yourself from STDs if you could be exposed to these types of infections. Use birth control if you do not want to become pregnant and are sexually active. Avoid heavy alcohol use. If you drink, keep it to less than 2 drinks/day and not every day. [place].  Choose someone you trust that could speak for you if you became unable to speak for yourself. Depression is common in our stressful world.If you're feeling down or losing interest in things you normally enjoy, please come in for a visit. If anyone is threatening or hurting you, please get help. Physical or Emotional Violence is never OK.

## 2022-10-04 NOTE — Progress Notes (Unsigned)
Subjective  Chief Complaint  Patient presents with   Annual Exam    Pt here for Annual exam and is currently fasting. Pt stated that she may have a yeast/bacterial infection    HPI: Natasha Mcclain is a 56 y.o. female who presents to [facility] [place] today for a Female Wellness Visit. She also has the concerns and/or needs as listed above in the chief complaint. These will be addressed in addition to the Health Maintenance Visit.   Wellness Visit: annual visit with health maintenance review and exam {With-without:32421} Pap  *** Chronic disease f/u and/or acute problem visit: (deemed necessary to be done in addition to the wellness visit): ***  Assessment  1. Annual physical exam   2. Encounter for screening mammogram for breast cancer   3. History of left breast cancer 2009   4. Attention deficit hyperactivity disorder (ADHD), combined type   5. Major depression, recurrent, chronic [facility])      Plan  Female Wellness Visit: Age appropriate Health Maintenance and Prevention measures were discussed with patient. Included topics are cancer screening recommendations, ways to keep healthy (see AVS) including dietary and exercise recommendations, regular eye and dental care, use of seat belts, and avoidance of moderate alcohol use and tobacco use.  BMI: discussed patient's BMI and encouraged positive lifestyle modifications to help get to or maintain a target BMI. HM needs and immunizations were addressed and ordered. See below for orders. See HM and immunization section for updates. Routine labs and screening tests ordered including cmp, cbc and lipids where appropriate. Discussed recommendations regarding Vit D and calcium supplementation (see AVS)  Chronic disease management visit and/or acute problem visit: *** Follow up: No follow-ups on file.  No orders of the defined types were placed in this encounter.  No orders of the defined types were placed in this  encounter.     Body mass index is 23.24 kg/m. Wt Readings from Last 3 Encounters:  10/04/22 157 lb 6.4 oz (71.4 kg)  03/30/22 158 lb 9.6 oz (71.9 kg)  01/09/22 156 lb (70.8 kg)     Patient Active Problem List   Diagnosis Date Noted   Chronic prescription benzodiazepine use 12/29/2019    Priority: High    Prescribed by prior pcp for neck muscle spasma    Attention deficit hyperactivity disorder (ADHD), combined type 01/13/2019    Priority: High   History of left breast cancer 2009 08/13/2013    Priority: High    2009, mastectomy left, chemo and radiation. [facility] manages.  Negative BRCA testing; mother passed from breast cancer Sister with breast cancer 51    Major depression, recurrent, chronic ([place]) 07/06/2011    Priority: High    Started with dx of breast cancer    Family history of colon cancer 12/29/2019    Priority: Medium     1st cousin father's side, 59, female 1st cousin father's side, 18s, female    Muscle spasms of neck 01/13/2019    Priority: Medium    Allergic conjunctivitis 12/29/2019    Priority: Low   Seasonal allergies 01/13/2019    Priority: Low   Ovarian cyst 01/06/2011    Priority: Low   DJD (degenerative joint disease) of cervical spine 03/30/2022   Degenerative joint disease (DJD) of lumbar spine 03/30/2022   Colon polyps 09/26/2021   Drug reaction 12/17/2020   Health Maintenance  Topic Date Due   INFLUENZA VACCINE  06/13/2022   MAMMOGRAM  07/26/2022  COVID-19 Vaccine (3 - 2023-24 season) 10/20/2022 (Originally 07/14/2022)   PAP SMEAR-Modifier  07/02/2023   COLONOSCOPY (Pts 45-42yr Insurance coverage will need to be confirmed)  01/20/2024   Hepatitis C Screening  Completed   HIV Screening  Completed   Zoster Vaccines- Shingrix  Completed   HPV VACCINES  Aged Out   Immunization History  Administered Date(s) Administered   Influenza Whole 07/30/2019   Influenza, Seasonal, Injecte, Preservative Fre 08/30/2008   Influenza,inj,Quad PF,6+ Mos  10/01/2018, 07/30/2019   Influenza-Unspecified 09/22/2020   Janssen (J&J) SARS-COV-2 Vaccination 05/10/2020, 11/11/2020   Tdap 10/01/2018   Zoster Recombinat (Shingrix) 03/30/2021, 09/26/2021   We updated and reviewed the patient's past history in detail and it is documented below. Allergies: Patient is allergic to paclitaxel, other, short ragweed pollen ext, tramadol, and tape. Past Medical History Patient  has a past medical history of Allergic conjunctivitis (12/29/2019), Allergy, Arthritis, Breast cancer ([place], Depression, Eczema, Family history of colon cancer (12/29/2019), and Genital warts. Past Surgical History Patient  has a past surgical history that includes Breast biopsy (2009); Mastectomy (Left); Breast reconstruction (Left, 2010); and breast reduction on right. Family History: Patient family history includes Breast cancer in her mother; Cancer in her sister; Colon cancer in her cousin; Diabetes in her brother, mother, and sister; Early death in her father and mother; Hypertension in her sister; Miscarriages / Stillbirths in her mother. Social History:  Patient  reports that she has never smoked. She has never used smokeless tobacco. She reports current alcohol use. She reports that she does not currently use drugs.  Review of Systems: Constitutional: negative for fever or malaise Ophthalmic: negative for photophobia, double vision or loss of vision Cardiovascular: negative for chest pain, dyspnea on exertion, or new LE swelling Respiratory: negative for SOB or persistent cough Gastrointestinal: negative for abdominal pain, change in bowel habits or melena Genitourinary: negative for dysuria or gross hematuria, no abnormal uterine bleeding or disharge Musculoskeletal: negative for new gait disturbance or muscular weakness Integumentary: negative for new or persistent rashes, no breast lumps Neurological: negative for TIA or stroke symptoms Psychiatric: negative for SI or  delusions Allergic/Immunologic: negative for hives  Patient Care Team    Relationship Specialty Notifications Start End  ALeamon Arnt MD PCP - General Family Medicine  12/29/19     Objective  Vitals: BP 130/80   Pulse 99   Temp 97.6 F (36.4 C)   Ht _0  (1.753 m)   Wt 157 lb 6.4 oz (71.4 kg)   SpO2 99%   BMI 23.24 kg/m  General:  Well developed, well nourished, no acute distress  Psych:  Alert and orientedx3,normal mood and affect HEENT:  Normocephalic, atraumatic, non-icteric sclera,  supple neck without adenopathy, mass or thyromegaly Cardiovascular:  Normal S1, S2, RRR without gallop, rub or murmur Respiratory:  Good breath sounds bilaterally, CTAB with normal respiratory effort Gastrointestinal: normal bowel sounds, soft, non-tender, no noted masses. No HSM MSK: no deformities, contusions. Joints are without erythema or swelling.  Skin:  Warm, no rashes or suspicious lesions noted Neurologic:    Mental status is normal. CN 2-11 are normal. Gross motor and sensory exams are normal. Normal gait. No tremor Breast Exam: No mass, skin retraction or nipple discharge is appreciated in either breast. No axillary adenopathy. Fibrocystic changes {Actions; are/are not:16769} noted Pelvic Exam: Normal external genitalia, no vulvar or vaginal lesions present. Clear cervix w/o CMT. Bimanual exam reveals a nontender fundus w/o masses, nl size. No adnexal masses present.  No inguinal adenopathy. A PAP smear {WAS/WAS NOT:[phone]::"was not"} performed.   Commons side effects, risks, benefits, and alternatives for medications and treatment plan prescribed today were discussed, and the patient expressed understanding of the given instructions. Patient is instructed to call or message via MyChart if he/she has any questions or concerns regarding our treatment plan. No barriers to understanding were identified. We discussed Red Flag symptoms and signs in detail. Patient expressed understanding  regarding what to do in case of urgent or emergency type symptoms.  Medication list was reconciled, printed and provided to the patient in AVS. Patient instructions and summary information was reviewed with the patient as documented in the AVS. This note was prepared with assistance of Dragon voice recognition software. Occasional wrong-word or sound-a-like substitutions may have occurred due to the inherent limitations of voice recognition software

## 2022-10-05 DIAGNOSIS — G8929 Other chronic pain: Secondary | ICD-10-CM | POA: Insufficient documentation

## 2022-10-05 MED ORDER — FLUCONAZOLE 150 MG PO TABS: ORAL_TABLET | ORAL | 0 refills | Status: DC

## 2022-10-10 DIAGNOSIS — I972 Postmastectomy lymphedema syndrome: Secondary | ICD-10-CM | POA: Diagnosis not present

## 2022-10-10 DIAGNOSIS — C50912 Malignant neoplasm of unspecified site of left female breast: Secondary | ICD-10-CM | POA: Diagnosis not present

## 2022-10-10 DIAGNOSIS — L7682 Other postprocedural complications of skin and subcutaneous tissue: Secondary | ICD-10-CM | POA: Diagnosis not present

## 2022-10-10 DIAGNOSIS — L905 Scar conditions and fibrosis of skin: Secondary | ICD-10-CM | POA: Diagnosis not present

## 2022-10-12 DIAGNOSIS — I972 Postmastectomy lymphedema syndrome: Secondary | ICD-10-CM | POA: Diagnosis not present

## 2022-10-12 DIAGNOSIS — C50912 Malignant neoplasm of unspecified site of left female breast: Secondary | ICD-10-CM | POA: Diagnosis not present

## 2022-10-19 DIAGNOSIS — Z853 Personal history of malignant neoplasm of breast: Secondary | ICD-10-CM | POA: Diagnosis not present

## 2022-10-19 DIAGNOSIS — I890 Lymphedema, not elsewhere classified: Secondary | ICD-10-CM | POA: Diagnosis not present

## 2022-10-24 DIAGNOSIS — C50912 Malignant neoplasm of unspecified site of left female breast: Secondary | ICD-10-CM | POA: Diagnosis not present

## 2022-10-24 DIAGNOSIS — I972 Postmastectomy lymphedema syndrome: Secondary | ICD-10-CM | POA: Diagnosis not present

## 2022-10-24 DIAGNOSIS — L905 Scar conditions and fibrosis of skin: Secondary | ICD-10-CM | POA: Diagnosis not present

## 2022-10-24 DIAGNOSIS — L7682 Other postprocedural complications of skin and subcutaneous tissue: Secondary | ICD-10-CM | POA: Diagnosis not present

## 2022-11-10 DIAGNOSIS — C50912 Malignant neoplasm of unspecified site of left female breast: Secondary | ICD-10-CM | POA: Diagnosis not present

## 2022-11-10 DIAGNOSIS — I972 Postmastectomy lymphedema syndrome: Secondary | ICD-10-CM | POA: Diagnosis not present

## 2022-11-11 DIAGNOSIS — I972 Postmastectomy lymphedema syndrome: Secondary | ICD-10-CM | POA: Diagnosis not present

## 2022-11-11 DIAGNOSIS — C50912 Malignant neoplasm of unspecified site of left female breast: Secondary | ICD-10-CM | POA: Diagnosis not present

## 2022-11-28 DIAGNOSIS — C50912 Malignant neoplasm of unspecified site of left female breast: Secondary | ICD-10-CM | POA: Diagnosis not present

## 2022-11-28 DIAGNOSIS — I972 Postmastectomy lymphedema syndrome: Secondary | ICD-10-CM | POA: Diagnosis not present

## 2022-11-28 DIAGNOSIS — L7682 Other postprocedural complications of skin and subcutaneous tissue: Secondary | ICD-10-CM | POA: Diagnosis not present

## 2022-11-28 DIAGNOSIS — L905 Scar conditions and fibrosis of skin: Secondary | ICD-10-CM | POA: Diagnosis not present

## 2022-12-05 DIAGNOSIS — L7682 Other postprocedural complications of skin and subcutaneous tissue: Secondary | ICD-10-CM | POA: Diagnosis not present

## 2022-12-05 DIAGNOSIS — C50912 Malignant neoplasm of unspecified site of left female breast: Secondary | ICD-10-CM | POA: Diagnosis not present

## 2022-12-05 DIAGNOSIS — I972 Postmastectomy lymphedema syndrome: Secondary | ICD-10-CM | POA: Diagnosis not present

## 2022-12-05 DIAGNOSIS — L905 Scar conditions and fibrosis of skin: Secondary | ICD-10-CM | POA: Diagnosis not present

## 2022-12-07 ENCOUNTER — Telehealth: Payer: Self-pay | Admitting: Family Medicine

## 2022-12-07 NOTE — Telephone Encounter (Signed)
Patient called for a refill of Adderall 20 mg. Informed patient that medication was sent in on 01/21 and reminded her of PCP AVS message from [place] on 10/04/22. Pt verbalized understanding.   Patient requests PCP mychart her to confirm if she needs to have an OV in February for med refill or if she should just request meds through mychart.

## 2023-01-03 ENCOUNTER — Other Ambulatory Visit: Payer: Self-pay

## 2023-01-03 ENCOUNTER — Encounter: Payer: Self-pay | Admitting: Family Medicine

## 2023-01-03 DIAGNOSIS — M62838 Other muscle spasm: Secondary | ICD-10-CM

## 2023-01-03 DIAGNOSIS — F902 Attention-deficit hyperactivity disorder, combined type: Secondary | ICD-10-CM

## 2023-01-03 NOTE — Telephone Encounter (Signed)
Last OV: 10/04/22 dx. CPE Adderall Last refill: 12/06/22 #90, 0  Valium Last refill: 10/04/22 #90, 1

## 2023-01-05 MED ORDER — AMPHETAMINE-DEXTROAMPHETAMINE 20 MG PO TABS: 20.0000 mg | ORAL_TABLET | Freq: Three times a day (TID) | ORAL | 0 refills | Status: DC | PRN

## 2023-01-05 NOTE — Telephone Encounter (Signed)
Let her know that the diazepam was filled in November for 6 months (90 day supply with 1 refill). So needs to request refill from pharmacy. Adderall was refilled.

## 2023-01-10 ENCOUNTER — Other Ambulatory Visit: Payer: Self-pay | Admitting: Family Medicine

## 2023-02-02 ENCOUNTER — Telehealth: Payer: Self-pay | Admitting: Family Medicine

## 2023-02-02 DIAGNOSIS — F902 Attention-deficit hyperactivity disorder, combined type: Secondary | ICD-10-CM

## 2023-02-02 NOTE — Telephone Encounter (Signed)
  Encourage patient to contact the pharmacy for refills or they can request refills through [facility]:  Please schedule appointment if longer than 1 year  NEXT APPOINTMENT DATE:  MEDICATION:  amphetamine-dextroamphetamine (ADDERALL) 20 MG tablet   Is the patient out of medication? YES  [id number] [place], [place] - [address] AT [address] Phone: [phone]  Fax: [phone]      Let patient know to contact pharmacy at the end of the day to make sure medication is ready.  Please notify patient to allow 48-72 hours to process

## 2023-02-03 ENCOUNTER — Encounter: Payer: Self-pay | Admitting: Family Medicine

## 2023-02-03 DIAGNOSIS — F902 Attention-deficit hyperactivity disorder, combined type: Secondary | ICD-10-CM

## 2023-02-06 MED ORDER — AMPHETAMINE-DEXTROAMPHETAMINE 20 MG PO TABS: 20.0000 mg | ORAL_TABLET | Freq: Three times a day (TID) | ORAL | 0 refills | Status: DC | PRN

## 2023-02-07 NOTE — Telephone Encounter (Signed)
Message sent thru MyChart to let pt know that there should be one more refill on the adderall and to see if she got that one filled.

## 2023-02-22 ENCOUNTER — Other Ambulatory Visit: Payer: Self-pay | Admitting: Family Medicine

## 2023-03-07 MED ORDER — AMPHETAMINE-DEXTROAMPHETAMINE 20 MG PO TABS: 20.0000 mg | ORAL_TABLET | Freq: Three times a day (TID) | ORAL | 0 refills | Status: DC | PRN

## 2023-04-04 ENCOUNTER — Encounter: Payer: Self-pay | Admitting: Family Medicine

## 2023-04-04 ENCOUNTER — Ambulatory Visit (INDEPENDENT_AMBULATORY_CARE_PROVIDER_SITE_OTHER): Payer: BC Managed Care – PPO | Admitting: Family Medicine

## 2023-04-04 VITALS — BP 126/70 | HR 83 | Temp 98.0°F | Ht 69.0 in | Wt 158.8 lb

## 2023-04-04 DIAGNOSIS — Z79899 Other long term (current) drug therapy: Secondary | ICD-10-CM

## 2023-04-04 DIAGNOSIS — M62838 Other muscle spasm: Secondary | ICD-10-CM | POA: Diagnosis not present

## 2023-04-04 DIAGNOSIS — F902 Attention-deficit hyperactivity disorder, combined type: Secondary | ICD-10-CM

## 2023-04-04 DIAGNOSIS — G8929 Other chronic pain: Secondary | ICD-10-CM

## 2023-04-04 DIAGNOSIS — M654 Radial styloid tenosynovitis [de Quervain]: Secondary | ICD-10-CM

## 2023-04-04 MED ORDER — AMPHETAMINE-DEXTROAMPHETAMINE 20 MG PO TABS: 20.0000 mg | ORAL_TABLET | Freq: Three times a day (TID) | ORAL | 0 refills | Status: DC | PRN

## 2023-04-04 MED ORDER — DIAZEPAM 10 MG PO TABS: 10.0000 mg | ORAL_TABLET | Freq: Every evening | ORAL | 1 refills | Status: DC | PRN

## 2023-04-04 MED ORDER — AMPHETAMINE-DEXTROAMPHETAMINE 20 MG PO TABS
20.0000 mg | ORAL_TABLET | Freq: Three times a day (TID) | ORAL | 0 refills | Status: DC | PRN
Start: 2023-10-01 — End: 2023-10-09

## 2023-04-04 NOTE — Patient Instructions (Addendum)
Please return in 6 months for your annual complete physical; please come fasting.    I have sent in your adderall refills to your pharmacy. I have refilled your valium.  If you have any questions or concerns, please don't hesitate to send me a message via MyChart or call the office at [phone]. Thank you for visiting with [place] today! It's our pleasure caring for you.   De Quervain's Tenosynovitis  De Quervain's tenosynovitis is a condition that causes inflammation of the tendon on the thumb side of the wrist. Tendons are cords of tissue that connect bones to muscles. The tendons in the hand pass through a tunnel called a sheath. A slippery layer of tissue (synovium) lets the tendons move smoothly in the sheath. With de Quervain's tenosynovitis, the sheath swells or thickens, causing friction and pain. The condition is also called de Quervain's disease and de Quervain's syndrome. It occurs most often in women who are 38-45 years old. What are the causes? The exact cause of this condition is not known. It may be associated with overuse of the hand and wrist. What increases the risk? You are more likely to develop this condition if you: Use your hands far more than normal, especially if you repeat certain movements that involve twisting your hand or using a tight grip. Are pregnant. Are a middle-aged woman. Have rheumatoid arthritis. Have diabetes. What are the signs or symptoms? The main symptom of this condition is pain on the thumb side of the wrist. The pain may get worse when you grasp something or turn your wrist. Other symptoms may include: Pain that extends up the forearm. Swelling of your wrist and hand. Trouble moving the thumb and wrist. A sensation of snapping in the wrist. A bump filled with fluid (cyst) in the area of the pain. How is this diagnosed? This condition may be diagnosed based on: Your symptoms and medical history. A physical exam. During the exam, your health  care provider may do a simple test Lourena Simmonds test) that involves pulling your thumb and wrist to see if this causes pain. You may also need to have an X-ray or ultrasound. How is this treated? Treatment for this condition may include: Avoiding any activity that causes pain and swelling. Taking medicines. Anti-inflammatory medicines and corticosteroid injections may be used to reduce inflammation and relieve pain. Wearing a splint. Having surgery. This may be needed if other treatments do not work. Once the pain and swelling have gone down, you may start: Physical therapy. This includes exercises to improve movement and strength in your wrist and thumb. Occupational therapy. This includes adjusting how you move your wrist. Follow these instructions at home: If you have a splint: Wear the splint as told by your health care provider. Remove it only as told by your health care provider. Loosen the splint if your fingers tingle, become numb, or turn cold and blue. Keep the splint clean. If the splint is not waterproof: Do not let it get wet. Cover it with a watertight covering when you take a bath or a shower. Managing pain, stiffness, and swelling  Avoid movements and activities that cause pain and swelling in the wrist area. If directed, put ice on the painful area. This may be helpful after doing activities that involve the sore wrist. To do this: Put ice in a plastic bag. Place a towel between your skin and the bag. Leave the ice on for 20 minutes, 2-3 times a day. Remove the ice if  your skin turns bright red. This is very important. If you cannot feel pain, heat, or cold, you have a greater risk of damage to the area. Move your fingers often to reduce stiffness and swelling. Raise (elevate) the injured area above the level of your heart while you are sitting or lying down. General instructions Return to your normal activities as told by your health care provider. Ask your health care  provider what activities are safe for you. Take over-the-counter and prescription medicines only as told by your health care provider. Keep all follow-up visits. This is important. Contact a health care provider if: Your pain medicine does not help. Your pain gets worse. You develop new symptoms. Summary De Quervain's tenosynovitis is a condition that causes inflammation of the tendon on the thumb side of the wrist. The condition occurs most often in women who are 48-12 years old. The exact cause of this condition is not known. It may be associated with overuse of the hand and wrist. Treatment starts with avoiding activity that causes pain or swelling in the wrist area. Other treatments may include wearing a splint and taking medicine. Sometimes, surgery is needed. This information is not intended to replace advice given to you by your health care provider. Make sure you discuss any questions you have with your health care provider. Document Revised: 02/11/2020 Document Reviewed: 02/11/2020 Elsevier Patient Education  2023 [employer].

## 2023-04-04 NOTE — Progress Notes (Signed)
Subjective  CC:  Chief Complaint  Patient presents with   ADHD    HPI: Natasha Mcclain is a 57 y.o. female who presents to the office today to address the problems listed above in the chief complaint.  ADD/ADHD  follow up:  She is taking medication as directed and continues to feel it is beneficial. The medications continue to help with focus and attention and task completion. She denies adverse side effects; specifically no headaches, appetite suppression, weight loss, sleeping difficulty, heart palpitations, chest pain or significant weight changes. Complains of right wrist pain.  Was caring many bags of groceries on her right forearm when the bags slipped, she caught the grocery bags with her thumb.  Since she has pain with certain motions of her wrist and thumb.  No swelling.  No bruising.  No history of wrist problems.  Normal grip strength. Continues to use Valium nightly for her neck spasms and chronic pain.  Works well.  Needs refills.  Assessment  1. Attention deficit hyperactivity disorder (ADHD), combined type   2. Muscle spasms of neck   3. Other chronic pain   4. Chronic prescription benzodiazepine use   5. De Quervain's tenosynovitis, right      Plan  ADD: This medical condition is well controlled. There are no signs of complications, medication side effects, or red flags. Patient is instructed to continue the current treatment plan without change in therapies or medications. I reviewed patient's records from the PMP aware controlled substance registry today.  I reviewed Adderall 20 mg 3 times daily for the neck 6 months. Refilled Valium for chronic neck muscle spasms. De Quervain's tenosynovitis right: Education given.  Start RICE, thumb spica splint and Advil for 2 weeks.  Follow-up if not improving.   Follow up: 6 months for complete physical No orders of the defined types were placed in this encounter.  Meds ordered this encounter  Medications    amphetamine-dextroamphetamine (ADDERALL) 20 MG tablet    Sig: Take 1 tablet (20 mg total) by mouth 3 (three) times daily as needed.    Dispense:  90 tablet    Refill:  0   amphetamine-dextroamphetamine (ADDERALL) 20 MG tablet    Sig: Take 1 tablet (20 mg total) by mouth 3 (three) times daily as needed.    Dispense:  90 tablet    Refill:  0   amphetamine-dextroamphetamine (ADDERALL) 20 MG tablet    Sig: Take 1 tablet (20 mg total) by mouth 3 (three) times daily as needed.    Dispense:  90 tablet    Refill:  0   amphetamine-dextroamphetamine (ADDERALL) 20 MG tablet    Sig: Take 1 tablet (20 mg total) by mouth 3 (three) times daily as needed.    Dispense:  90 tablet    Refill:  0   amphetamine-dextroamphetamine (ADDERALL) 20 MG tablet    Sig: Take 1 tablet (20 mg total) by mouth 3 (three) times daily as needed.    Dispense:  90 tablet    Refill:  0   amphetamine-dextroamphetamine (ADDERALL) 20 MG tablet    Sig: Take 1 tablet (20 mg total) by mouth 3 (three) times daily as needed.    Dispense:  90 tablet    Refill:  0   diazepam (VALIUM) 10 MG tablet    Sig: Take 1 tablet (10 mg total) by mouth at bedtime as needed (muscle spasam).    Dispense:  90 tablet    Refill:  1  I reviewed the patients updated PMH, FH, and SocHx.    Patient Active Problem List   Diagnosis Date Noted   Chronic pain 10/05/2022    Priority: High   Post-mastectomy lymphedema syndrome 08/11/2022    Priority: High   Chronic prescription benzodiazepine use 12/29/2019    Priority: High   Attention deficit hyperactivity disorder (ADHD), combined type 01/13/2019    Priority: High   History of left breast cancer 2009 08/13/2013    Priority: High   Major depression, recurrent, chronic (HCC) 07/06/2011    Priority: High   Family history of colon cancer 12/29/2019    Priority: Medium    Muscle spasms of neck 01/13/2019    Priority: Medium    Allergic conjunctivitis 12/29/2019    Priority: Low   Seasonal  allergies 01/13/2019    Priority: Low   Ovarian cyst 01/06/2011    Priority: Low   DJD (degenerative joint disease) of cervical spine 03/30/2022   Degenerative joint disease (DJD) of lumbar spine 03/30/2022   Colon polyps 09/26/2021   Drug reaction 12/17/2020   Current Meds  Medication Sig   Cetirizine HCl 10 MG CAPS Take 1 capsule (10 mg total) by mouth daily.   fluticasone (FLONASE) 50 MCG/ACT nasal spray INSTILL 1 SPRAY INTO BOTH NOSTRILS DAILY   gabapentin (NEURONTIN) 100 MG capsule TAKE 1-3 CAPSULES (100-300 MG TOTAL) BY MOUTH AT BEDTIME.   Olopatadine HCl 0.2 % SOLN Gets OTC now   venlafaxine (EFFEXOR) 75 MG tablet TAKE 2 TABLETS BY MOUTH EVERY DAY   [DISCONTINUED] amphetamine-dextroamphetamine (ADDERALL) 20 MG tablet Take 1 tablet (20 mg total) by mouth 3 (three) times daily as needed.   [DISCONTINUED] diazepam (VALIUM) 10 MG tablet Take 1 tablet (10 mg total) by mouth at bedtime as needed (muscle spasam).    Allergies: Patient is allergic to paclitaxel, other, short ragweed pollen ext, tramadol, and tape. Family History: Patient family history includes Breast cancer in her mother; Cancer in her sister; Colon cancer in her cousin; Diabetes in her brother, mother, and sister; Early death in her father and mother; Hypertension in her sister; Miscarriages / Stillbirths in her mother. Social History:  Patient  reports that she has never smoked. She has never used smokeless tobacco. She reports current alcohol use. She reports that she does not currently use drugs.  Review of Systems: Constitutional: Negative for fever malaise or anorexia Cardiovascular: negative for chest pain Respiratory: negative for SOB or persistent cough Gastrointestinal: negative for abdominal pain  Objective  Vitals: BP 126/70   Pulse 83   Temp 98 F (36.7 C)   Ht 5\' 9"  (1.753 m)   Wt 158 lb 12.8 oz (72 kg)   SpO2 97%   BMI 23.45 kg/m  General: no acute distress , A&Ox3 Cardiovascular:  RRR  without murmur or gallop.  Right wrist: Normal appearing, no joint tenderness, positive Finkelstein and tenderness over extensor tendon.   Commons side effects, risks, benefits, and alternatives for medications and treatment plan prescribed today were discussed, and the patient expressed understanding of the given instructions. Patient is instructed to call or message via MyChart if he/she has any questions or concerns regarding our treatment plan. No barriers to understanding were identified. We discussed Red Flag symptoms and signs in detail. Patient expressed understanding regarding what to do in case of urgent or emergency type symptoms.  Medication list was reconciled, printed and provided to the patient in AVS. Patient instructions and summary information was reviewed with the patient as  documented in the AVS. This note was prepared with assistance of Dragon voice recognition software. Occasional wrong-word or sound-a-like substitutions may have occurred due to the inherent limitations of voice recognition software

## 2023-05-07 ENCOUNTER — Other Ambulatory Visit: Payer: Self-pay | Admitting: Family Medicine

## 2023-05-07 DIAGNOSIS — F902 Attention-deficit hyperactivity disorder, combined type: Secondary | ICD-10-CM

## 2023-05-07 NOTE — Telephone Encounter (Signed)
LOV: 04/04/2023  Last refill Date: 04/04/2023  Qty: 90  Refills: 0

## 2023-05-07 NOTE — Telephone Encounter (Signed)
Is almost out  Prescription Request  05/07/2023  LOV: 04/04/2023  What is the name of the medication or equipment?  amphetamine-dextroamphetamine (ADDERALL) 20 MG tablet   Have you contacted your pharmacy to request a refill? No   Which pharmacy would you like this sent to?  [id number] - [place], [place] - [address] AT [address] OF [facility] [address] [place] [address] Phone: [phone] Fax: [phone]    Patient notified that their request is being sent to the clinical staff for review and that they should receive a response within 2 business days.   Please advise at Mobile [phone] (mobile)

## 2023-05-29 ENCOUNTER — Encounter: Payer: Self-pay | Admitting: Family Medicine

## 2023-05-29 DIAGNOSIS — M654 Radial styloid tenosynovitis [de Quervain]: Secondary | ICD-10-CM

## 2023-05-30 ENCOUNTER — Ambulatory Visit (INDEPENDENT_AMBULATORY_CARE_PROVIDER_SITE_OTHER): Payer: BC Managed Care – PPO | Admitting: Family Medicine

## 2023-05-30 ENCOUNTER — Encounter: Payer: Self-pay | Admitting: Family Medicine

## 2023-05-30 VITALS — BP 124/80 | HR 87 | Temp 97.8°F | Ht 69.0 in | Wt 163.6 lb

## 2023-05-30 DIAGNOSIS — M654 Radial styloid tenosynovitis [de Quervain]: Secondary | ICD-10-CM

## 2023-05-30 MED ORDER — TRIAMCINOLONE ACETONIDE 40 MG/ML IJ SUSP
40.0000 mg | Freq: Once | INTRAMUSCULAR | Status: DC
Start: 2023-05-30 — End: 2023-05-30

## 2023-05-30 MED ORDER — TRIAMCINOLONE ACETONIDE 40 MG/ML IJ SUSP
40.0000 mg | Freq: Once | INTRAMUSCULAR | Status: AC
Start: 2023-05-30 — End: 2023-05-30
  Administered 2023-05-30: 40 mg via INTRA_ARTICULAR

## 2023-05-30 NOTE — Patient Instructions (Signed)
Please return in November for your complete physical  You may wear a thumb spica splint for the next 2 weeks as needed. If you have any questions or concerns, please don't hesitate to send me a message via MyChart or call the office at [phone]. Thank you for visiting with [place] today! It's our pleasure caring for you.   You had a steroid injection today.   Things to be aware of after this injection are listed below: You may experience no significant improvement or even a slight worsening in your symptoms during the first 24 to 48 hours.  After that we expect your symptoms to improve gradually over the next 2 weeks for the medicine to have its maximal effect.  You should continue to have improvement out to 6 weeks after your injection. I recommend icing the site of the injection for 20 minutes  1-2 times the day of your injection You may shower but no swimming, tub bath or Jacuzzi for 24 hours. If your bandage falls off this does not need to be replaced.  It is appropriate to remove the bandage after 4 hours. You may resume light activities as tolerated.     POSSIBLE PROCEDURE SIDE EFFECTS: The side effects of the injection are usually fairly minimal however if you may experience some of the following side effects that are usually self-limited and will is off on their own.  If you are concerned please feel free to call the office with questions:             Increased numbness or tingling             Nausea or vomiting             Swelling or bruising at the injection site    Please call our office if if you experience any of the following symptoms over the next week as these can be signs of infection:              Fever greater than 100.71F             Significant swelling at the injection site             Significant redness or drainage from the injection site

## 2023-05-30 NOTE — Progress Notes (Signed)
You check Subjective  CC:  Chief Complaint  Patient presents with   Wrist Pain    HPI: Natasha Mcclain is a 57 y.o. female who presents to the office today to address the problems listed above in the chief complaint. Patient with persistent right thumb pain.  Diagnosed with de Quervain's in May.  Did use oral NSAIDs but has not been otherwise treating.  Now painful with most movements.  Reports swelling at times.  No wrist pain.  No other hand pain.  No injury.  Assessment  1. De Quervain's tenosynovitis, right      Plan  Persistent sxs: Education given.  Elected steroid injection.  See below.  Tolerated well.  Recommend thumb spica splint for 2 weeks, icing 2-3 times daily for the next 2 to 3 days.  Follow-up if not improving.  Follow up: November for complete physical Visit date not found  No orders of the defined types were placed in this encounter.  No orders of the defined types were placed in this encounter.     I reviewed the patients updated PMH, FH, and SocHx.    Patient Active Problem List   Diagnosis Date Noted   Chronic pain 10/05/2022    Priority: High   Post-mastectomy lymphedema syndrome 08/11/2022    Priority: High   Chronic prescription benzodiazepine use 12/29/2019    Priority: High   Attention deficit hyperactivity disorder (ADHD), combined type 01/13/2019    Priority: High   History of left breast cancer 2009 08/13/2013    Priority: High   Major depression, recurrent, chronic (HCC) 07/06/2011    Priority: High   Family history of colon cancer 12/29/2019    Priority: Medium    Muscle spasms of neck 01/13/2019    Priority: Medium    Allergic conjunctivitis 12/29/2019    Priority: Low   Seasonal allergies 01/13/2019    Priority: Low   Ovarian cyst 01/06/2011    Priority: Low   DJD (degenerative joint disease) of cervical spine 03/30/2022   Degenerative joint disease (DJD) of lumbar spine 03/30/2022   Colon polyps 09/26/2021   Drug reaction  12/17/2020   Current Meds  Medication Sig   amphetamine-dextroamphetamine (ADDERALL) 20 MG tablet Take 1 tablet (20 mg total) by mouth 3 (three) times daily as needed.   amphetamine-dextroamphetamine (ADDERALL) 20 MG tablet Take 1 tablet (20 mg total) by mouth 3 (three) times daily as needed.   [START ON 07/03/2023] amphetamine-dextroamphetamine (ADDERALL) 20 MG tablet Take 1 tablet (20 mg total) by mouth 3 (three) times daily as needed.   [START ON 08/02/2023] amphetamine-dextroamphetamine (ADDERALL) 20 MG tablet Take 1 tablet (20 mg total) by mouth 3 (three) times daily as needed.   [START ON 09/01/2023] amphetamine-dextroamphetamine (ADDERALL) 20 MG tablet Take 1 tablet (20 mg total) by mouth 3 (three) times daily as needed.   [START ON 10/01/2023] amphetamine-dextroamphetamine (ADDERALL) 20 MG tablet Take 1 tablet (20 mg total) by mouth 3 (three) times daily as needed.   Cetirizine HCl 10 MG CAPS Take 1 capsule (10 mg total) by mouth daily.   diazepam (VALIUM) 10 MG tablet Take 1 tablet (10 mg total) by mouth at bedtime as needed (muscle spasam).   fluticasone (FLONASE) 50 MCG/ACT nasal spray INSTILL 1 SPRAY INTO BOTH NOSTRILS DAILY   gabapentin (NEURONTIN) 100 MG capsule TAKE 1-3 CAPSULES (100-300 MG TOTAL) BY MOUTH AT BEDTIME.   Olopatadine HCl 0.2 % SOLN Gets OTC now   venlafaxine (EFFEXOR) 75 MG tablet TAKE 2  TABLETS BY MOUTH EVERY DAY    Allergies: Patient is allergic to paclitaxel, other, short ragweed pollen ext, tramadol, and tape. Family History: Patient family history includes Breast cancer in her mother; Cancer in her sister; Colon cancer in her cousin; Diabetes in her brother, mother, and sister; Early death in her father and mother; Hypertension in her sister; Miscarriages / Stillbirths in her mother. Social History:  Patient  reports that she has never smoked. She has never used smokeless tobacco. She reports current alcohol use. She reports that she does not currently use  drugs.  Review of Systems: Constitutional: Negative for fever malaise or anorexia Cardiovascular: negative for chest pain Respiratory: negative for SOB or persistent cough Gastrointestinal: negative for abdominal pain  Objective  Vitals: BP 124/80   Pulse 87   Temp 97.8 F (36.6 C)   Ht 5\' 9"  (1.753 m)   Wt 163 lb 9.6 oz (74.2 kg)   SpO2 97%   BMI 24.16 kg/m  General: no acute distress , A&Ox3 Right hand appears normal, thumb extensor tendons are tender, positive Finkelstein's.  Positive pain with resisted extension.  Dequervain's tenosynovitis Steroid Injection Procedure Note  Pre-operative Diagnosis: De Quervain's Tenosynovitis, right  Post-operative Diagnosis: same  Indications: pain and treatment   Anesthesia:cold spray  Procedure Details   Verbal consent was obtained for the procedure. Universal time out done. The area between the affected extensor tendons of the thumb were palpated and marked.  The skin prepped with alcohol and cold spray was used for anesthesia.  A 1 1/2" 25ga needle was advanced into the skin over the affected area of the extensor tendons and  0.25 ml of triamcinolone (KENALOG) 40mg /ml with 0.72ml of lidocaine 1% was injected.   Complications:  None; patient tolerated the procedure well.  Commons side effects, risks, benefits, and alternatives for medications and treatment plan prescribed today were discussed, and the patient expressed understanding of the given instructions. Patient is instructed to call or message via MyChart if he/she has any questions or concerns regarding our treatment plan. No barriers to understanding were identified. We discussed Red Flag symptoms and signs in detail. Patient expressed understanding regarding what to do in case of urgent or emergency type symptoms.  Medication list was reconciled, printed and provided to the patient in AVS. Patient instructions and summary information was reviewed with the patient as documented  in the AVS. This note was prepared with assistance of Dragon voice recognition software. Occasional wrong-word or sound-a-like substitutions may have occurred due to the inherent limitations of voice recognition software

## 2023-05-30 NOTE — Addendum Note (Signed)
Addended by: Trudie Reed A on: 05/30/2023 02:29 PM   Modules accepted: Orders

## 2023-06-06 ENCOUNTER — Other Ambulatory Visit: Payer: Self-pay | Admitting: Family Medicine

## 2023-06-06 ENCOUNTER — Encounter: Payer: Self-pay | Admitting: Family Medicine

## 2023-06-06 DIAGNOSIS — F902 Attention-deficit hyperactivity disorder, combined type: Secondary | ICD-10-CM

## 2023-06-06 MED ORDER — AMPHETAMINE-DEXTROAMPHETAMINE 20 MG PO TABS
20.0000 mg | ORAL_TABLET | Freq: Three times a day (TID) | ORAL | 0 refills | Status: DC | PRN
Start: 2023-06-06 — End: 2023-11-22

## 2023-06-06 NOTE — Telephone Encounter (Signed)
Patient called just to make sure message was received from after hours line. I was able to confirm this. States that she hopes this can be filled soon since she doesn't want to miss any doses.

## 2023-06-06 NOTE — Telephone Encounter (Signed)
[facility] stated [facility]. She asking to have her July refill sent to another pharmacy.  RX:  amphetamine-dextroamphetamine (ADDERALL) 20 MG tablet   [facility] [address], [place], [place] [address] [phone]

## 2023-06-07 ENCOUNTER — Other Ambulatory Visit: Payer: Self-pay

## 2023-06-07 DIAGNOSIS — F902 Attention-deficit hyperactivity disorder, combined type: Secondary | ICD-10-CM

## 2023-07-02 ENCOUNTER — Other Ambulatory Visit: Payer: Self-pay | Admitting: Family Medicine

## 2023-08-06 DIAGNOSIS — C50912 Malignant neoplasm of unspecified site of left female breast: Secondary | ICD-10-CM | POA: Diagnosis not present

## 2023-08-06 DIAGNOSIS — Z1231 Encounter for screening mammogram for malignant neoplasm of breast: Secondary | ICD-10-CM | POA: Diagnosis not present

## 2023-08-06 DIAGNOSIS — Z170 Estrogen receptor positive status [ER+]: Secondary | ICD-10-CM | POA: Diagnosis not present

## 2023-08-06 LAB — HM MAMMOGRAPHY

## 2023-08-07 DIAGNOSIS — C50812 Malignant neoplasm of overlapping sites of left female breast: Secondary | ICD-10-CM | POA: Diagnosis not present

## 2023-08-07 DIAGNOSIS — Z170 Estrogen receptor positive status [ER+]: Secondary | ICD-10-CM | POA: Diagnosis not present

## 2023-08-07 DIAGNOSIS — Z803 Family history of malignant neoplasm of breast: Secondary | ICD-10-CM | POA: Diagnosis not present

## 2023-08-07 DIAGNOSIS — N946 Dysmenorrhea, unspecified: Secondary | ICD-10-CM | POA: Diagnosis not present

## 2023-08-07 DIAGNOSIS — Z808 Family history of malignant neoplasm of other organs or systems: Secondary | ICD-10-CM | POA: Diagnosis not present

## 2023-08-07 DIAGNOSIS — Z923 Personal history of irradiation: Secondary | ICD-10-CM | POA: Diagnosis not present

## 2023-08-07 DIAGNOSIS — Z9012 Acquired absence of left breast and nipple: Secondary | ICD-10-CM | POA: Diagnosis not present

## 2023-08-07 DIAGNOSIS — C50912 Malignant neoplasm of unspecified site of left female breast: Secondary | ICD-10-CM | POA: Diagnosis not present

## 2023-08-07 DIAGNOSIS — I890 Lymphedema, not elsewhere classified: Secondary | ICD-10-CM | POA: Diagnosis not present

## 2023-08-16 ENCOUNTER — Telehealth: Payer: Self-pay | Admitting: Family Medicine

## 2023-08-16 NOTE — Telephone Encounter (Signed)
Prescription Request  08/16/2023  LOV: 05/30/2023  What is the name of the medication or equipment? venlafaxine (EFFEXOR) 75 MG tablet   PT STATES SHE WAS STRESS CLEANING AND THINKS SHE THREW THESE AWAY BY ACCIDENT.   Have you contacted your pharmacy to request a refill? Yes   Which pharmacy would you like this sent to?   [id number] - , [place] - [address] AT [address] OF [facility] [address] [place] [address] Phone: [phone] Fax: [phone]     Patient notified that their request is being sent to the clinical staff for review and that they should receive a response within 2 business days.   Please advise at Mobile [phone] (mobile)

## 2023-08-16 NOTE — Telephone Encounter (Signed)
Please disregard message below. Medication was able to be filled.

## 2023-09-21 ENCOUNTER — Telehealth: Payer: Self-pay | Admitting: Family Medicine

## 2023-09-21 ENCOUNTER — Other Ambulatory Visit: Payer: Self-pay

## 2023-09-21 DIAGNOSIS — M62838 Other muscle spasm: Secondary | ICD-10-CM

## 2023-09-21 MED ORDER — GABAPENTIN 100 MG PO CAPS: 100.0000 mg | ORAL_CAPSULE | Freq: Every day | ORAL | 3 refills | Status: DC

## 2023-09-21 MED ORDER — DIAZEPAM 10 MG PO TABS
10.0000 mg | ORAL_TABLET | Freq: Every evening | ORAL | 1 refills | Status: DC | PRN
Start: 2023-09-21 — End: 2024-04-09

## 2023-09-21 NOTE — Telephone Encounter (Signed)
Prescription Request  09/21/2023  LOV: 05/30/2023  What is the name of the medication or equipment? Vdiazepam (VALIUM) 10 MG tablet // gabapentin (NEURONTIN) 100 MG capsule   Have you contacted your pharmacy to request a refill? Yes   Which pharmacy would you like this sent to?  [id number] - [place], [place] - [address] AT [address] [facility] [address]   Patient notified that their request is being sent to the clinical staff for review and that they should receive a response within 2 business days.   Please advise at Mobile [phone] (mobile)

## 2023-09-27 NOTE — Telephone Encounter (Signed)
Patient called stating that pharmacy informed her that she can't get refill of valium until PCP states it is okay. Also states if an increase dosage is recommended by PCP, then there needs to be a new rx order.

## 2023-09-28 ENCOUNTER — Encounter: Payer: Self-pay | Admitting: Family Medicine

## 2023-09-28 NOTE — Telephone Encounter (Signed)
Pharmacy has been contacted to do an early release on valium.

## 2023-10-07 ENCOUNTER — Encounter: Payer: Self-pay | Admitting: Family Medicine

## 2023-10-08 ENCOUNTER — Encounter: Payer: BC Managed Care – PPO | Admitting: Family Medicine

## 2023-10-08 NOTE — Telephone Encounter (Signed)
Pt called for an update

## 2023-10-08 NOTE — Telephone Encounter (Signed)
Pt would like a call back when the RX is sent. [facility] has been having a [facility] with [facility].

## 2023-10-09 ENCOUNTER — Other Ambulatory Visit: Payer: Self-pay | Admitting: Family

## 2023-10-09 ENCOUNTER — Encounter: Payer: Self-pay | Admitting: Family Medicine

## 2023-10-09 DIAGNOSIS — F902 Attention-deficit hyperactivity disorder, combined type: Secondary | ICD-10-CM

## 2023-10-09 MED ORDER — AMPHETAMINE-DEXTROAMPHETAMINE 20 MG PO TABS
20.0000 mg | ORAL_TABLET | Freq: Three times a day (TID) | ORAL | 0 refills | Status: DC | PRN
Start: 2023-10-09 — End: 2023-11-22

## 2023-10-09 NOTE — Telephone Encounter (Signed)
Pt called again expressing concern that this wont be done in time for her to take her medication especially because of the holiday. I informed pt that Dr. Hyman Hopes has been sent the message and will be responding as quickly as she can.

## 2023-10-09 NOTE — Telephone Encounter (Signed)
Pt is speaking of her Adderall in her message below. This medication needs to be re-sent to pharmacy since pharmacy informed pt that they do not have the rx order on file.

## 2023-10-09 NOTE — Telephone Encounter (Signed)
This message has been send to [employer] to address.

## 2023-10-09 NOTE — Telephone Encounter (Signed)
Called and informed pt of refill. Pt verbalized understanding and expressed great gratitude to all parties involved.

## 2023-11-01 ENCOUNTER — Other Ambulatory Visit: Payer: Self-pay | Admitting: Family Medicine

## 2023-11-01 DIAGNOSIS — F902 Attention-deficit hyperactivity disorder, combined type: Secondary | ICD-10-CM

## 2023-11-05 MED ORDER — AMPHETAMINE-DEXTROAMPHETAMINE 20 MG PO TABS
20.0000 mg | ORAL_TABLET | Freq: Three times a day (TID) | ORAL | 0 refills | Status: DC | PRN
Start: 2023-11-05 — End: 2023-11-22

## 2023-11-22 ENCOUNTER — Encounter: Payer: Self-pay | Admitting: Family Medicine

## 2023-11-22 ENCOUNTER — Ambulatory Visit (INDEPENDENT_AMBULATORY_CARE_PROVIDER_SITE_OTHER): Payer: BC Managed Care – PPO | Admitting: Family Medicine

## 2023-11-22 VITALS — BP 114/80 | HR 86 | Temp 97.7°F | Ht 69.0 in | Wt 167.8 lb

## 2023-11-22 DIAGNOSIS — Z853 Personal history of malignant neoplasm of breast: Secondary | ICD-10-CM

## 2023-11-22 DIAGNOSIS — Z23 Encounter for immunization: Secondary | ICD-10-CM | POA: Diagnosis not present

## 2023-11-22 DIAGNOSIS — Z79899 Other long term (current) drug therapy: Secondary | ICD-10-CM | POA: Diagnosis not present

## 2023-11-22 DIAGNOSIS — Z0000 Encounter for general adult medical examination without abnormal findings: Secondary | ICD-10-CM | POA: Diagnosis not present

## 2023-11-22 DIAGNOSIS — F339 Major depressive disorder, recurrent, unspecified: Secondary | ICD-10-CM

## 2023-11-22 DIAGNOSIS — G8929 Other chronic pain: Secondary | ICD-10-CM | POA: Diagnosis not present

## 2023-11-22 DIAGNOSIS — Z13220 Encounter for screening for lipoid disorders: Secondary | ICD-10-CM

## 2023-11-22 DIAGNOSIS — Z0001 Encounter for general adult medical examination with abnormal findings: Secondary | ICD-10-CM

## 2023-11-22 DIAGNOSIS — K635 Polyp of colon: Secondary | ICD-10-CM

## 2023-11-22 DIAGNOSIS — F902 Attention-deficit hyperactivity disorder, combined type: Secondary | ICD-10-CM | POA: Diagnosis not present

## 2023-11-22 DIAGNOSIS — M62838 Other muscle spasm: Secondary | ICD-10-CM

## 2023-11-22 DIAGNOSIS — Z800 Family history of malignant neoplasm of digestive organs: Secondary | ICD-10-CM

## 2023-11-22 LAB — CBC WITH DIFFERENTIAL/PLATELET
Basophils Absolute: 0.1 10*3/uL (ref 0.0–0.1)
Basophils Relative: 0.9 % (ref 0.0–3.0)
Eosinophils Absolute: 0.1 10*3/uL (ref 0.0–0.7)
Eosinophils Relative: 1.6 % (ref 0.0–5.0)
HCT: 40.7 % (ref 36.0–46.0)
Hemoglobin: 13.5 g/dL (ref 12.0–15.0)
Lymphocytes Relative: 26.5 % (ref 12.0–46.0)
Lymphs Abs: 1.9 10*3/uL (ref 0.7–4.0)
MCHC: 33.1 g/dL (ref 30.0–36.0)
MCV: 89.4 fL (ref 78.0–100.0)
Monocytes Absolute: 0.6 10*3/uL (ref 0.1–1.0)
Monocytes Relative: 8.1 % (ref 3.0–12.0)
Neutro Abs: 4.4 10*3/uL (ref 1.4–7.7)
Neutrophils Relative %: 62.9 % (ref 43.0–77.0)
Platelets: 293 10*3/uL (ref 150.0–400.0)
RBC: 4.55 Mil/uL (ref 3.87–5.11)
RDW: 12.8 % (ref 11.5–15.5)
WBC: 7 10*3/uL (ref 4.0–10.5)

## 2023-11-22 LAB — LIPID PANEL
Cholesterol: 213 mg/dL — ABNORMAL HIGH (ref 0–200)
HDL: 77.6 mg/dL (ref >39.00)
LDL Cholesterol: 112 mg/dL — ABNORMAL HIGH (ref 0–99)
NonHDL: 135.12
Total CHOL/HDL Ratio: 3
Triglycerides: 116 mg/dL (ref 0.0–149.0)
VLDL: 23.2 mg/dL (ref 0.0–40.0)

## 2023-11-22 LAB — COMPREHENSIVE METABOLIC PANEL
ALT: 19 U/L (ref 0–35)
AST: 19 U/L (ref 0–37)
Albumin: 4.4 g/dL (ref 3.5–5.2)
Alkaline Phosphatase: 126 U/L — ABNORMAL HIGH (ref 39–117)
BUN: 7 mg/dL (ref 6–23)
CO2: 28 meq/L (ref 19–32)
Calcium: 9.3 mg/dL (ref 8.4–10.5)
Chloride: 101 meq/L (ref 96–112)
Creatinine, Ser: 0.58 mg/dL (ref 0.40–1.20)
GFR: 101.51 mL/min (ref >60.00)
Glucose, Bld: 167 mg/dL — ABNORMAL HIGH (ref 70–99)
Potassium: 3.8 meq/L (ref 3.5–5.1)
Sodium: 137 meq/L (ref 135–145)
Total Bilirubin: 0.2 mg/dL (ref 0.2–1.2)
Total Protein: 7.4 g/dL (ref 6.0–8.3)

## 2023-11-22 LAB — TSH: TSH: 3.94 u[IU]/mL (ref 0.35–5.50)

## 2023-11-22 MED ORDER — AMPHETAMINE-DEXTROAMPHETAMINE 20 MG PO TABS
20.0000 mg | ORAL_TABLET | Freq: Three times a day (TID) | ORAL | 0 refills | Status: DC | PRN
Start: 2023-11-22 — End: 2024-04-14

## 2023-11-22 MED ORDER — AMPHETAMINE-DEXTROAMPHETAMINE 20 MG PO TABS
20.0000 mg | ORAL_TABLET | Freq: Three times a day (TID) | ORAL | 0 refills | Status: DC | PRN
Start: 2023-12-22 — End: 2024-01-11

## 2023-11-22 MED ORDER — AMPHETAMINE-DEXTROAMPHETAMINE 20 MG PO TABS
20.0000 mg | ORAL_TABLET | Freq: Three times a day (TID) | ORAL | 0 refills | Status: DC | PRN
Start: 2024-04-20 — End: 2024-05-26

## 2023-11-22 MED ORDER — AMPHETAMINE-DEXTROAMPHETAMINE 20 MG PO TABS
20.0000 mg | ORAL_TABLET | Freq: Three times a day (TID) | ORAL | 0 refills | Status: DC | PRN
Start: 2024-01-21 — End: 2024-04-14

## 2023-11-22 MED ORDER — AMPHETAMINE-DEXTROAMPHETAMINE 20 MG PO TABS: 20.0000 mg | ORAL_TABLET | Freq: Three times a day (TID) | ORAL | 0 refills | Status: DC | PRN

## 2023-11-22 MED ORDER — AMPHETAMINE-DEXTROAMPHETAMINE 20 MG PO TABS
20.0000 mg | ORAL_TABLET | Freq: Three times a day (TID) | ORAL | 0 refills | Status: DC | PRN
Start: 2024-03-21 — End: 2024-04-14

## 2023-11-22 NOTE — Progress Notes (Signed)
 Subjective  Chief Complaint  Patient presents with   Annual Exam    Pt here for Annual Exam and is currently fasting    ADHD    HPI: Natasha Mcclain is a 58 y.o. female who presents to [facility] [place] today for a Female Wellness Visit. She also has the concerns and/or needs as listed above in the chief complaint. These will be addressed in addition to the Health Maintenance Visit.   Wellness Visit: annual visit with health maintenance review and exam  HM: Mammogram reviewed and normal and current.  Colonoscopy done in 2020 with hyperplastic polyp, recheck in 10 years.  Pap smear normal with negative HPV in 2021.  All screens current.  Immunizations are up-to-date eligible for flu shot today. Chronic disease f/u and/or acute problem visit: (deemed necessary to be done in addition to the wellness visit): ADD remains well-controlled on 3 times daily Adderall.  No adverse effects.  Long-term use. Chronic pain, osteoarthritis discussed in detail.  Continues on same management program with Valium  for muscle spasms and gabapentin  .  Uses as needed Advil .  Complains of thumb pain. Eczema and dry skin, fingertips are cracking.  Doing a lot of housework, cleaning etc.  No redness Major depression is well-controlled on Effexor  high-dose.  Assessment  1. Encounter for well adult exam with abnormal findings   2. Attention deficit hyperactivity disorder (ADHD), combined type   3. Other chronic pain   4. Chronic prescription benzodiazepine use   5. Muscle spasms of neck   6. Major depression, recurrent, chronic (HCC)   7. History of left breast cancer 2009   8. Family history of colon cancer   9. Need for influenza vaccination   10. Hyperplastic colonic polyp, unspecified part of colon      Plan  Female Wellness Visit: Age appropriate Health Maintenance and Prevention measures were discussed with patient. Included topics are cancer screening recommendations, ways to keep  healthy (see AVS) including dietary and exercise recommendations, regular eye and dental care, use of seat belts, and avoidance of moderate alcohol use and tobacco use.  BMI: discussed patient's BMI and encouraged positive lifestyle modifications to help get to or maintain a target BMI. HM needs and immunizations were addressed and ordered. See below for orders. See HM and immunization section for updates. Routine labs and screening tests ordered including cmp, cbc and lipids where appropriate. Discussed recommendations regarding Vit D and calcium supplementation (see AVS)  Chronic disease management visit and/or acute problem visit: ADD is well-controlled.  Refilled medication. Chronic pain is managed with Valium  and gabapentin .  Prescriptions are current.  Discussed osteoarthritis of first M CP.  Education given.  Voltaren topical recommended.  Recurrent dequervains tenosynovitis, return if needed. Depression stable on Effexor  100 mg daily.  Follow up: 6 months for ADD recheck Orders Placed This Encounter  Procedures   Flu vaccine trivalent PF, 6mos and older(Flulaval,Afluria,Fluarix,Fluzone)   HM PAP SMEAR   CBC with Differential/Platelet   Comprehensive metabolic panel   Lipid panel   TSH   Meds ordered this encounter  Medications   amphetamine -dextroamphetamine  (ADDERALL) 20 MG tablet    Sig: Take 1 tablet (20 mg total) by mouth 3 (three) times daily as needed.    Dispense:  90 tablet    Refill:  0   amphetamine -dextroamphetamine  (ADDERALL) 20 MG tablet    Sig: Take 1 tablet (20 mg total) by mouth 3 (three) times daily as needed.    Dispense:  90 tablet    Refill:  0   amphetamine -dextroamphetamine  (ADDERALL) 20 MG tablet    Sig: Take 1 tablet (20 mg total) by mouth 3 (three) times daily as needed.    Dispense:  90 tablet    Refill:  0   amphetamine -dextroamphetamine  (ADDERALL) 20 MG tablet    Sig: Take 1 tablet (20 mg total) by mouth 3 (three) times daily as needed.     Dispense:  90 tablet    Refill:  0   amphetamine -dextroamphetamine  (ADDERALL) 20 MG tablet    Sig: Take 1 tablet (20 mg total) by mouth 3 (three) times daily as needed.    Dispense:  90 tablet    Refill:  0   amphetamine -dextroamphetamine  (ADDERALL) 20 MG tablet    Sig: Take 1 tablet (20 mg total) by mouth 3 (three) times daily as needed.    Dispense:  90 tablet    Refill:  0      Body mass index is 24.78 kg/m. Wt Readings from Last 3 Encounters:  11/22/23 167 lb 12.8 oz (76.1 kg)  05/30/23 163 lb 9.6 oz (74.2 kg)  04/04/23 158 lb 12.8 oz (72 kg)     Patient Active Problem List   Diagnosis Date Noted Date Diagnosed   Chronic pain 10/05/2022     Priority: High    DJD neck and back; chronic ankle pain (post traumatic); gabapentin  100-300 qhs. valium     Post-mastectomy lymphedema syndrome 08/11/2022     Priority: High   DJD (degenerative joint disease) of cervical spine 03/30/2022     Priority: High   Degenerative joint disease (DJD) of lumbar spine 03/30/2022     Priority: High   Chronic prescription benzodiazepine use 12/29/2019     Priority: High    Prescribed by prior pcp for neck muscle spasms    Attention deficit hyperactivity disorder (ADHD), combined type 01/13/2019     Priority: High   History of left breast cancer 2009 08/13/2013     Priority: High    2009, mastectomy left, chemo and radiation. [facility] manages.  Negative BRCA testing; mother passed from breast cancer Sister with breast cancer 51    Major depression, recurrent, chronic (HCC) 07/06/2011     Priority: High    Started with dx of breast cancer; effexor  150mg  daily long term    Hyperplastic colon polyp 09/26/2021     Priority: Medium     Colonoscopy 2020, Dr. Shila, benign, recommended repeat in 10 years.     Family history of colon cancer - not 1st degree relatives, routine screening recommended 12/29/2019     Priority: Medium     1st cousin father's side, 66, female 1st cousin father's side,  45s, female    Muscle spasms of neck 01/13/2019     Priority: Medium    Allergic conjunctivitis 12/29/2019     Priority: Low   Seasonal allergies 01/13/2019     Priority: Low   Ovarian cyst 01/06/2011     Priority: Low   Health Maintenance  Topic Date Due   COVID-19 Vaccine (3 - 2024-25 season) 12/08/2023 (Originally 07/15/2023)   MAMMOGRAM  08/05/2024   Cervical Cancer Screening (HPV/Pap Cotest)  07/01/2025   DTaP/Tdap/Td (2 - Td or Tdap) 10/01/2028   Colonoscopy  01/19/2029   INFLUENZA VACCINE  Completed   Hepatitis C Screening  Completed   HIV Screening  Completed   Zoster Vaccines- Shingrix   Completed   HPV VACCINES  Aged [employer] History  Administered Date(s) Administered   Influenza Whole 07/30/2019   Influenza, Seasonal, Injecte, Preservative Fre 08/30/2008, 11/22/2023   Influenza,inj,Quad PF,6+ Mos 10/01/2018, 07/30/2019   Influenza-Unspecified 09/22/2020   Janssen (J&J) SARS-COV-2 Vaccination 05/10/2020, 11/11/2020   Tdap 10/01/2018   Zoster Recombinant(Shingrix ) 03/30/2021, 09/26/2021   We updated and reviewed the patient's past history in detail and it is documented below. Allergies: Patient is allergic to paclitaxel, other, short ragweed pollen ext, tramadol , and tape. Past Medical History Patient  has a past medical history of Allergic conjunctivitis (12/29/2019), Allergy, Arthritis, Breast cancer (HCC), Depression, Eczema, Family history of colon cancer (12/29/2019), and Genital warts. Past Surgical History Patient  has a past surgical history that includes Breast biopsy (2009); Mastectomy (Left); Breast reconstruction (Left, 2010); breast reduction on right; and Cosmetic surgery (2010). Family History: Patient family history includes Breast cancer in her mother; Cancer in her sister; Colon cancer in her cousin; Diabetes in her brother, mother, and sister; Early death in her father and mother; Hypertension in her sister; Miscarriages / Stillbirths in her  mother; Stroke in her maternal aunt and maternal grandmother. Social History:  Patient  reports that she has never smoked. She has never used smokeless tobacco. She reports that she does not currently use alcohol. She reports that she does not use drugs.  Review of Systems: Constitutional: negative for fever or malaise Ophthalmic: negative for photophobia, double vision or loss of vision Cardiovascular: negative for chest pain, dyspnea on exertion, or new LE swelling Respiratory: negative for SOB or persistent cough Gastrointestinal: negative for abdominal pain, change in bowel habits or melena Genitourinary: negative for dysuria or gross hematuria, no abnormal uterine bleeding or disharge Musculoskeletal: negative for new gait disturbance or muscular weakness Integumentary: negative for new or persistent rashes, no breast lumps Neurological: negative for TIA or stroke symptoms Psychiatric: negative for SI or delusions Allergic/Immunologic: negative for hives  Patient Care Team    Relationship Specialty Notifications Start End  Jodie Lavern CROME, MD PCP - General Family Medicine  12/29/19     Objective  Vitals: BP 114/80   Pulse 86   Temp 97.7 F (36.5 C)   Ht 5' 9 (1.753 m)   Wt 167 lb 12.8 oz (76.1 kg)   SpO2 97%   BMI 24.78 kg/m  General:  Well developed, well nourished, no acute distress  Psych:  Alert and orientedx3,normal mood and affect HEENT:  Normocephalic, atraumatic, non-icteric sclera,  supple neck without adenopathy, mass or thyromegaly Cardiovascular:  Normal S1, S2, RRR without gallop, rub or murmur Respiratory:  Good breath sounds bilaterally, CTAB with normal respiratory effort Gastrointestinal: normal bowel sounds, soft, non-tender, no noted masses. No HSM, mildly distended MSK: extremities without edema, joints without erythema or swelling, subluxed MCP on right, positive Finkelstein's Neurologic:    Mental status is normal.  Gross motor and sensory exams are  normal.  No tremor  Commons side effects, risks, benefits, and alternatives for medications and treatment plan prescribed today were discussed, and the patient expressed understanding of the given instructions. Patient is instructed to call or message via MyChart if he/she has any questions or concerns regarding our treatment plan. No barriers to understanding were identified. We discussed Red Flag symptoms and signs in detail. Patient expressed understanding regarding what to do in case of urgent or emergency type symptoms.  Medication list was reconciled, printed and provided to the patient in AVS. Patient instructions and summary information was reviewed with the patient as documented in the AVS. This note  was prepared with assistance of Conservation officer, historic buildings. Occasional wrong-word or sound-a-like substitutions may have occurred due to the inherent limitations of voice recognition software

## 2023-11-22 NOTE — Patient Instructions (Signed)
 Please return in 6 months to recheck ADD   I will release your lab results to you on your MyChart account with further instructions. You may see the results before I do, but when I review them I will send you a message with my report or have my assistant call you if things need to be discussed. Please reply to my message with any questions. Thank you!   If you have any questions or concerns, please don't hesitate to send me a message via MyChart or call the office at [phone]. Thank you for visiting with us  today! It's our pleasure caring for you.   VISIT SUMMARY:  During today's visit, we discussed your chronic pain, arthritis, eczema, and general health maintenance. We reviewed your current symptoms and history, and I provided recommendations to help manage your conditions. We also ensured that your routine health screenings and vaccinations are up to date.  YOUR PLAN:  -CHRONIC PAIN/ARTHRITIS/MUSCLE SPASMS: Chronic pain is long-lasting pain that persists beyond the usual recovery period or occurs along with a chronic health condition. Continue your pain medicationsas needed and diazepam  and gabapentin . For your thumb arthritis pain, I recommend using Voltaren gel for joint pain and Advil  as needed. Continue with yoga and Arnica for relief.  -ECZEMA: Eczema is a condition that makes your skin red, inflamed, and itchy. For your severe dryness and cracking of the skin, especially on your hands, I recommend using Vanicream or Vaseline at night and Cetaphil or Dove sensitive skin products for hand washing. Wear gloves when cleaning to avoid irritation.  -ADD:  This is well controlled and I have refilled your medications for 6 months.   -DEPRESSION: please continue your effexor  and your daily spiritual practices.   -GENERAL HEALTH MAINTENANCE: Your routine health screenings, including mammogram, colonoscopy, and Pap smear, are up to date. You received a flu shot today, and all other vaccinations  are current. Continue with your routine health screenings as scheduled and ensure your vaccinations remain up to date.  INSTRUCTIONS:  Please complete the blood work for kidney, liver, cholesterol, thyroid , and blood counts as ordered. We will review the results once they are available. Schedule a follow-up appointment in six months.

## 2023-11-22 NOTE — Progress Notes (Signed)
Please add on hgba1c, dx: hyperglycemia Thanks, Dr. Elta Angell '

## 2023-11-23 ENCOUNTER — Other Ambulatory Visit: Payer: Self-pay

## 2023-11-23 DIAGNOSIS — R739 Hyperglycemia, unspecified: Secondary | ICD-10-CM

## 2023-11-23 NOTE — Progress Notes (Signed)
Lab order has been added.  

## 2023-11-30 ENCOUNTER — Telehealth: Payer: Self-pay | Admitting: Family Medicine

## 2023-11-30 ENCOUNTER — Other Ambulatory Visit: Payer: Self-pay

## 2023-11-30 DIAGNOSIS — R739 Hyperglycemia, unspecified: Secondary | ICD-10-CM

## 2023-11-30 NOTE — Telephone Encounter (Signed)
-----   Message from [place] Elease Hashimoto K sent at 11/30/2023 10:17 AM EST ----- Please contact pt to schedule lab appt to check A1c.  Thank you,  patrivci ----- Message ----- From: Willow Ora, MD Sent: 11/30/2023   9:07 AM EST To: Gabriel Rung, CMA  Please have her schedule a lab visit so we can get the A1c.

## 2023-11-30 NOTE — Telephone Encounter (Signed)
Spoke w/ pt regarding lab visit for A1c . Pt states she would like to hold off on labs for a bit . States she believes her lab results were due to her stress eating weeks  previous to the cpe appt and that she thinks her labs should be back to normal in a few weeks . Pt would like to confirm with provider if ok to hold off on labs .

## 2023-11-30 NOTE — Progress Notes (Signed)
Please have her schedule a lab visit so we can get the A1c.

## 2024-01-05 ENCOUNTER — Other Ambulatory Visit: Payer: Self-pay | Admitting: Family Medicine

## 2024-01-11 ENCOUNTER — Other Ambulatory Visit: Payer: Self-pay | Admitting: *Deleted

## 2024-01-11 DIAGNOSIS — F902 Attention-deficit hyperactivity disorder, combined type: Secondary | ICD-10-CM

## 2024-01-11 NOTE — Telephone Encounter (Signed)
 Copied from CRM [id number]. Topic: Clinical - Prescription Issue >> Jan 11, 2024 12:14 PM Corin V wrote: Reason for CRM: [facility] and [facility] were not able to complete the refill due 01/07/24. [facility] advised that [facility] at [address] [place], [place] [address] (Phone: [phone]) has some in stock if Dr. Mardelle Matte can call the prescription into them. Please let patient know when script is changed to new [facility]

## 2024-01-12 ENCOUNTER — Encounter: Payer: Self-pay | Admitting: Family Medicine

## 2024-01-13 MED ORDER — AMPHETAMINE-DEXTROAMPHETAMINE 20 MG PO TABS: 20.0000 mg | ORAL_TABLET | Freq: Three times a day (TID) | ORAL | 0 refills | Status: DC | PRN

## 2024-04-01 ENCOUNTER — Other Ambulatory Visit: Payer: Self-pay | Admitting: Family Medicine

## 2024-04-02 DIAGNOSIS — Z170 Estrogen receptor positive status [ER+]: Secondary | ICD-10-CM | POA: Diagnosis not present

## 2024-04-02 DIAGNOSIS — C50912 Malignant neoplasm of unspecified site of left female breast: Secondary | ICD-10-CM | POA: Diagnosis not present

## 2024-04-02 DIAGNOSIS — I972 Postmastectomy lymphedema syndrome: Secondary | ICD-10-CM | POA: Diagnosis not present

## 2024-04-09 ENCOUNTER — Other Ambulatory Visit: Payer: Self-pay | Admitting: Family Medicine

## 2024-04-09 ENCOUNTER — Encounter: Payer: Self-pay | Admitting: Family Medicine

## 2024-04-09 DIAGNOSIS — M62838 Other muscle spasm: Secondary | ICD-10-CM

## 2024-04-09 NOTE — Telephone Encounter (Signed)
 11/22/2023 LOV  09/21/2023 fill date  91/1 refill

## 2024-04-10 ENCOUNTER — Other Ambulatory Visit: Payer: Self-pay | Admitting: Family Medicine

## 2024-04-10 DIAGNOSIS — F902 Attention-deficit hyperactivity disorder, combined type: Secondary | ICD-10-CM

## 2024-04-10 NOTE — Telephone Encounter (Signed)
 Copied from CRM [id number]. Topic: Clinical - Medication Refill >> Apr 10, 2024 10:57 AM [place] wrote: Medication: amphetamine -dextroamphetamine  (ADDERALL) 20 MG tablet  Has the patient contacted their pharmacy? Yes (Agent: If no, request that the patient contact the pharmacy for the refill. If patient does not wish to contact the pharmacy document the reason why and proceed with request.) (Agent: If yes, when and what did the pharmacy advise?)  This is the patient's preferred pharmacy:  [id number] [place] [place] - [address] AT [address] OF [facility] [address] [place] [address] Phone: [phone] Fax: [phone]   Is this the correct pharmacy for this prescription? Yes If no, delete pharmacy and type the correct one.   Has the prescription been filled recently? Yes  Is the patient out of the medication? Yes  Has the patient been seen for an appointment in the last year OR does the patient have an upcoming appointment? Yes  Can we respond through MyChart? Yes  Agent: Please be advised that Rx refills may take up to 3 business days. We ask that you follow-up with your pharmacy.

## 2024-04-11 NOTE — Telephone Encounter (Signed)
 11/22/2023 LOV  11/22/2023 fill date  90/1 refills

## 2024-04-14 ENCOUNTER — Telehealth: Payer: Self-pay

## 2024-04-14 ENCOUNTER — Other Ambulatory Visit: Payer: Self-pay | Admitting: Family Medicine

## 2024-04-14 DIAGNOSIS — F902 Attention-deficit hyperactivity disorder, combined type: Secondary | ICD-10-CM

## 2024-04-14 MED ORDER — AMPHETAMINE-DEXTROAMPHETAMINE 20 MG PO TABS: 20.0000 mg | ORAL_TABLET | Freq: Three times a day (TID) | ORAL | 0 refills | Status: DC | PRN

## 2024-04-14 NOTE — Telephone Encounter (Signed)
 Copied from CRM [id number]. Topic: Clinical - Medication Refill >> Apr 14, 2024  1:34 PM Allyne Areola wrote: [facility] and [facility] told [facility] that they have not received a prescription. She will be out of the medication today and does not want to be without the medication. She would like to know if we can [facility] and give a verbal order.

## 2024-04-15 NOTE — Telephone Encounter (Signed)
 This has been taken care of.

## 2024-04-18 DIAGNOSIS — I972 Postmastectomy lymphedema syndrome: Secondary | ICD-10-CM | POA: Diagnosis not present

## 2024-04-18 DIAGNOSIS — L7682 Other postprocedural complications of skin and subcutaneous tissue: Secondary | ICD-10-CM | POA: Diagnosis not present

## 2024-04-18 DIAGNOSIS — C50912 Malignant neoplasm of unspecified site of left female breast: Secondary | ICD-10-CM | POA: Diagnosis not present

## 2024-04-18 DIAGNOSIS — Z170 Estrogen receptor positive status [ER+]: Secondary | ICD-10-CM | POA: Diagnosis not present

## 2024-05-14 ENCOUNTER — Encounter: Payer: Self-pay | Admitting: Family Medicine

## 2024-05-14 ENCOUNTER — Other Ambulatory Visit: Payer: Self-pay | Admitting: Family Medicine

## 2024-05-14 DIAGNOSIS — F902 Attention-deficit hyperactivity disorder, combined type: Secondary | ICD-10-CM

## 2024-05-14 MED ORDER — AMPHETAMINE-DEXTROAMPHETAMINE 20 MG PO TABS: 20.0000 mg | ORAL_TABLET | Freq: Three times a day (TID) | ORAL | 0 refills | Status: DC | PRN

## 2024-05-14 NOTE — Telephone Encounter (Unsigned)
 Copied from CRM [id number]. Topic: Clinical - Medication Refill >> May 14, 2024  2:32 PM Adrionna Y wrote: Medication: amphetamine -dextroamphetamine  (ADDERALL) 20 MG tablet    Patient would also like a [facility] since there has been some issues with them in the past /every month   Has the patient contacted their pharmacy? Yes (Agent: If no, request that the patient contact the pharmacy for the refill. If patient does not wish to contact the pharmacy document the reason why and proceed with request.) (Agent: If yes, when and what did the pharmacy advise?)  This is the patient's preferred pharmacy:  [id number] [place], [place] - [address] AT [address] OF [facility] [address]  [place] [address] Phone: [phone] Fax: [phone]   Is this the correct pharmacy for this prescription? Yes If no, delete pharmacy and type the correct one.   Has the prescription been filled recently? No  Is the patient out of the medication? No, has one pill left  Has the patient been seen for an appointment in the last year OR does the patient have an upcoming appointment? Yes  Can we respond through MyChart? Yes  Agent: Please be advised that Rx refills may take up to 3 business days. We ask that you follow-up with your pharmacy.

## 2024-05-21 ENCOUNTER — Ambulatory Visit: Payer: BC Managed Care – PPO | Admitting: Family Medicine

## 2024-05-23 ENCOUNTER — Encounter: Payer: Self-pay | Admitting: Family Medicine

## 2024-05-26 ENCOUNTER — Ambulatory Visit (INDEPENDENT_AMBULATORY_CARE_PROVIDER_SITE_OTHER): Admitting: Family Medicine

## 2024-05-26 ENCOUNTER — Ambulatory Visit: Payer: Self-pay | Admitting: Family Medicine

## 2024-05-26 ENCOUNTER — Encounter: Payer: Self-pay | Admitting: Family Medicine

## 2024-05-26 ENCOUNTER — Ambulatory Visit (INDEPENDENT_AMBULATORY_CARE_PROVIDER_SITE_OTHER)

## 2024-05-26 VITALS — BP 127/84 | HR 85 | Temp 97.7°F | Ht 69.0 in | Wt 158.4 lb

## 2024-05-26 DIAGNOSIS — F902 Attention-deficit hyperactivity disorder, combined type: Secondary | ICD-10-CM | POA: Diagnosis not present

## 2024-05-26 DIAGNOSIS — M25531 Pain in right wrist: Secondary | ICD-10-CM

## 2024-05-26 DIAGNOSIS — R739 Hyperglycemia, unspecified: Secondary | ICD-10-CM | POA: Diagnosis not present

## 2024-05-26 DIAGNOSIS — M7989 Other specified soft tissue disorders: Secondary | ICD-10-CM | POA: Diagnosis not present

## 2024-05-26 DIAGNOSIS — I972 Postmastectomy lymphedema syndrome: Secondary | ICD-10-CM

## 2024-05-26 MED ORDER — AMPHETAMINE-DEXTROAMPHETAMINE 20 MG PO TABS: 20.0000 mg | ORAL_TABLET | Freq: Three times a day (TID) | ORAL | 0 refills | Status: DC | PRN

## 2024-05-26 NOTE — Progress Notes (Signed)
 Subjective  CC:  Chief Complaint  Patient presents with   ADHD    HPI: Natasha Mcclain is a 58 y.o. female who presents to the office today to address the problems listed above in the chief complaint.  ADD/ADHD  follow up:  She is taking medication as directed and continues to feel it is beneficial. The medications continue to help with focus and attention and task completion. She denies adverse side effects; specifically no headaches, appetite suppression, weight loss, sleeping difficulty, heart palpitations, chest pain or significant weight changes. Lymphedema: stable C/o constant right wrist pain; fell several weeks ago. Has decreased ROM, chronically. Has h/o thumb tenosynovitis. No red swollen joints. Pain and wrist and base of thumb Sugar was 167 in January; pt reports she was not fasting. Never had A1c done and defers today. No sxs of hyperglycemia.  Assessment  1. Attention deficit hyperactivity disorder (ADHD), combined type   2. Hyperglycemia   3. Right wrist pain   4. Post-mastectomy lymphedema syndrome      Plan  ADD:  stable. Refilled meds x 6 months.  Discussed healthy diet. Will check fasting sugar at [facility] in January. Pt defers any further eval today. Xray and tylenol  for wrist pain. Suspect DJD 1st mcp and wrist.  Lymphedema; PT today.  I reviewed patient's records from the PMP aware controlled substance registry today.    Follow up: 6 mo for cpe and fu  Orders Placed This Encounter  Procedures   DG Wrist Complete Right   Meds ordered this encounter  Medications   amphetamine -dextroamphetamine  (ADDERALL) 20 MG tablet    Sig: Take 1 tablet (20 mg total) by mouth 3 (three) times daily as needed.    Dispense:  90 tablet    Refill:  0   amphetamine -dextroamphetamine  (ADDERALL) 20 MG tablet    Sig: Take 1 tablet (20 mg total) by mouth 3 (three) times daily as needed.    Dispense:  90 tablet    Refill:  0   amphetamine -dextroamphetamine  (ADDERALL) 20 MG tablet     Sig: Take 1 tablet (20 mg total) by mouth 3 (three) times daily as needed.    Dispense:  90 tablet    Refill:  0   amphetamine -dextroamphetamine  (ADDERALL) 20 MG tablet    Sig: Take 1 tablet (20 mg total) by mouth 3 (three) times daily as needed.    Dispense:  90 tablet    Refill:  0   amphetamine -dextroamphetamine  (ADDERALL) 20 MG tablet    Sig: Take 1 tablet (20 mg total) by mouth 3 (three) times daily as needed.    Dispense:  90 tablet    Refill:  0   amphetamine -dextroamphetamine  (ADDERALL) 20 MG tablet    Sig: Take 1 tablet (20 mg total) by mouth 3 (three) times daily as needed.    Dispense:  90 tablet    Refill:  0      I reviewed the patients updated PMH, FH, and SocHx.    Patient Active Problem List   Diagnosis Date Noted   Chronic pain 10/05/2022    Priority: High   Post-mastectomy lymphedema syndrome 08/11/2022    Priority: High   DJD (degenerative joint disease) of cervical spine 03/30/2022    Priority: High   Degenerative joint disease (DJD) of lumbar spine 03/30/2022    Priority: High   Chronic prescription benzodiazepine use 12/29/2019    Priority: High   Attention deficit hyperactivity disorder (ADHD), combined type 01/13/2019    Priority:  High   History of left breast cancer 2009 08/13/2013    Priority: High   Major depression, recurrent, chronic (HCC) 07/06/2011    Priority: High   Hyperplastic colon polyp 09/26/2021    Priority: Medium    Family history of colon cancer - not 1st degree relatives, routine screening recommended 12/29/2019    Priority: Medium    Muscle spasms of neck 01/13/2019    Priority: Medium    Allergic conjunctivitis 12/29/2019    Priority: Low   Seasonal allergies 01/13/2019    Priority: Low   Ovarian cyst 01/06/2011    Priority: Low   Current Meds  Medication Sig   Cetirizine  HCl 10 MG CAPS Take 1 capsule (10 mg total) by mouth daily.   diazepam  (VALIUM ) 10 MG tablet TAKE 1 TABLET (10 MG TOTAL) BY MOUTH AT BEDTIME AS  NEEDED (MUSCLE SPASAM).   fluticasone  (FLONASE ) 50 MCG/ACT nasal spray INHALE 1 SPRAY IN EACH NOSTRIL ONCE DAILY   gabapentin  (NEURONTIN ) 100 MG capsule Take 1-3 capsules (100-300 mg total) by mouth at bedtime.   Olopatadine HCl 0.2 % SOLN Gets OTC now   venlafaxine  (EFFEXOR ) 75 MG tablet TAKE 2 TABLETS BY MOUTH EVERY DAY   [DISCONTINUED] amphetamine -dextroamphetamine  (ADDERALL) 20 MG tablet Take 1 tablet (20 mg total) by mouth 3 (three) times daily as needed.   [DISCONTINUED] amphetamine -dextroamphetamine  (ADDERALL) 20 MG tablet Take 1 tablet (20 mg total) by mouth 3 (three) times daily as needed.    Allergies: Patient is allergic to paclitaxel, other, short ragweed pollen ext, tramadol , and tape. Family History: Patient family history includes Breast cancer in her mother; Cancer in her sister; Colon cancer in her cousin; Diabetes in her brother, mother, and sister; Early death in her father and mother; Hypertension in her sister; Miscarriages / Stillbirths in her mother; Stroke in her maternal aunt and maternal grandmother. Social History:  Patient  reports that she has never smoked. She has never used smokeless tobacco. She reports that she does not currently use alcohol. She reports that she does not use drugs.  Review of Systems: Constitutional: Negative for fever malaise or anorexia Cardiovascular: negative for chest pain Respiratory: negative for SOB or persistent cough Gastrointestinal: negative for abdominal pain  Objective  Vitals: BP 127/84   Pulse 85   Temp 97.7 F (36.5 C)   Ht 5' 9 (1.753 m)   Wt 158 lb 6.4 oz (71.8 kg)   SpO2 99%   BMI 23.39 kg/m  General: no acute distress , A&Ox3 Right wrist: decreased lateral and medial mvts, no erythema, warmth or ttp. Nl flexion and extension    Commons side effects, risks, benefits, and alternatives for medications and treatment plan prescribed today were discussed, and the patient expressed understanding of the given  instructions. Patient is instructed to call or message via MyChart if he/she has any questions or concerns regarding our treatment plan. No barriers to understanding were identified. We discussed Red Flag symptoms and signs in detail. Patient expressed understanding regarding what to do in case of urgent or emergency type symptoms.  Medication list was reconciled, printed and provided to the patient in AVS. Patient instructions and summary information was reviewed with the patient as documented in the AVS. This note was prepared with assistance of Dragon voice recognition software. Occasional wrong-word or sound-a-like substitutions may have occurred due to the inherent limitations of voice recognition software

## 2024-05-26 NOTE — Patient Instructions (Signed)
 Please return in 6 months for your annual complete physical; please come fasting.  For follow up on chronic medical conditions   If you have any questions or concerns, please don't hesitate to send me a message via MyChart or call the office at [phone]. Thank you for visiting with us  today! It's our pleasure caring for you.

## 2024-05-26 NOTE — Progress Notes (Signed)
 See mychart note Dear Ms. Natasha Mcclain, Your xray shows soft tissue swelling showing soft tissue injury that occurred with the fall. I recommend considering a wrist splint and time but if it is not improving, let me know and I would have you see a sports medicine specialist.  Sincerely, Dr. Jodie

## 2024-05-28 DIAGNOSIS — Z170 Estrogen receptor positive status [ER+]: Secondary | ICD-10-CM | POA: Diagnosis not present

## 2024-05-28 DIAGNOSIS — C50912 Malignant neoplasm of unspecified site of left female breast: Secondary | ICD-10-CM | POA: Diagnosis not present

## 2024-05-28 DIAGNOSIS — L7682 Other postprocedural complications of skin and subcutaneous tissue: Secondary | ICD-10-CM | POA: Diagnosis not present

## 2024-05-28 DIAGNOSIS — I972 Postmastectomy lymphedema syndrome: Secondary | ICD-10-CM | POA: Diagnosis not present

## 2024-06-06 DIAGNOSIS — I972 Postmastectomy lymphedema syndrome: Secondary | ICD-10-CM | POA: Diagnosis not present

## 2024-06-06 DIAGNOSIS — L7682 Other postprocedural complications of skin and subcutaneous tissue: Secondary | ICD-10-CM | POA: Diagnosis not present

## 2024-06-06 DIAGNOSIS — C50912 Malignant neoplasm of unspecified site of left female breast: Secondary | ICD-10-CM | POA: Diagnosis not present

## 2024-06-06 DIAGNOSIS — Z170 Estrogen receptor positive status [ER+]: Secondary | ICD-10-CM | POA: Diagnosis not present

## 2024-06-10 DIAGNOSIS — Z170 Estrogen receptor positive status [ER+]: Secondary | ICD-10-CM | POA: Diagnosis not present

## 2024-06-10 DIAGNOSIS — I972 Postmastectomy lymphedema syndrome: Secondary | ICD-10-CM | POA: Diagnosis not present

## 2024-06-10 DIAGNOSIS — L7682 Other postprocedural complications of skin and subcutaneous tissue: Secondary | ICD-10-CM | POA: Diagnosis not present

## 2024-06-10 DIAGNOSIS — C50912 Malignant neoplasm of unspecified site of left female breast: Secondary | ICD-10-CM | POA: Diagnosis not present

## 2024-06-16 ENCOUNTER — Telehealth: Payer: Self-pay

## 2024-06-16 NOTE — Telephone Encounter (Signed)
 noted

## 2024-06-16 NOTE — Telephone Encounter (Signed)
 Copied from CRM [id number]. Topic: Clinical - Prescription Issue >> Jun 13, 2024  1:29 PM Shereese L wrote: Reason for CRM: jacqueline from [facility] stated that she can only have a 3 month supply at a time and the sept/oct/nov scripts will be canceled and would have to be resent after 3 months are finish

## 2024-06-20 DIAGNOSIS — I972 Postmastectomy lymphedema syndrome: Secondary | ICD-10-CM | POA: Diagnosis not present

## 2024-06-20 DIAGNOSIS — C50912 Malignant neoplasm of unspecified site of left female breast: Secondary | ICD-10-CM | POA: Diagnosis not present

## 2024-06-20 DIAGNOSIS — Z170 Estrogen receptor positive status [ER+]: Secondary | ICD-10-CM | POA: Diagnosis not present

## 2024-06-20 DIAGNOSIS — L7682 Other postprocedural complications of skin and subcutaneous tissue: Secondary | ICD-10-CM | POA: Diagnosis not present

## 2024-07-01 DIAGNOSIS — C50912 Malignant neoplasm of unspecified site of left female breast: Secondary | ICD-10-CM | POA: Diagnosis not present

## 2024-07-01 DIAGNOSIS — L7682 Other postprocedural complications of skin and subcutaneous tissue: Secondary | ICD-10-CM | POA: Diagnosis not present

## 2024-07-01 DIAGNOSIS — I972 Postmastectomy lymphedema syndrome: Secondary | ICD-10-CM | POA: Diagnosis not present

## 2024-07-01 DIAGNOSIS — Z170 Estrogen receptor positive status [ER+]: Secondary | ICD-10-CM | POA: Diagnosis not present

## 2024-07-03 ENCOUNTER — Encounter: Payer: Self-pay | Admitting: Family Medicine

## 2024-07-04 ENCOUNTER — Other Ambulatory Visit: Payer: Self-pay | Admitting: Family

## 2024-07-04 DIAGNOSIS — S6991XD Unspecified injury of right wrist, hand and finger(s), subsequent encounter: Secondary | ICD-10-CM

## 2024-07-08 DIAGNOSIS — C50912 Malignant neoplasm of unspecified site of left female breast: Secondary | ICD-10-CM | POA: Diagnosis not present

## 2024-07-08 DIAGNOSIS — I972 Postmastectomy lymphedema syndrome: Secondary | ICD-10-CM | POA: Diagnosis not present

## 2024-07-08 DIAGNOSIS — Z170 Estrogen receptor positive status [ER+]: Secondary | ICD-10-CM | POA: Diagnosis not present

## 2024-07-08 DIAGNOSIS — L7682 Other postprocedural complications of skin and subcutaneous tissue: Secondary | ICD-10-CM | POA: Diagnosis not present

## 2024-07-21 ENCOUNTER — Encounter: Payer: Self-pay | Admitting: Family Medicine

## 2024-07-21 ENCOUNTER — Other Ambulatory Visit: Payer: Self-pay | Admitting: Family Medicine

## 2024-07-21 DIAGNOSIS — F902 Attention-deficit hyperactivity disorder, combined type: Secondary | ICD-10-CM

## 2024-07-21 NOTE — Telephone Encounter (Unsigned)
 Copied from CRM [id number]. Topic: Clinical - Medication Refill >> Jul 21, 2024 11:08 AM Zy'onna H wrote: Medication: amphetamine -dextroamphetamine  (ADDERALL) 20 MG tablet  (Starting on 07/25/2024)  **The patient stated she is out of Rx due to a discrepancy in the overlapping of dates that her Rx is normally ready.  **She would like if this could be filled ASAP, as she has work this evening around 4 and needs her Rx prior to going in.  Has the patient contacted their pharmacy? Yes (Agent: If no, request that the patient contact the pharmacy for the refill. If patient does not wish to contact the pharmacy document the reason why and proceed with request.) (Agent: If yes, when and what did the pharmacy advise?)  This is the patient's preferred pharmacy:  [id number] [place], [place] - [address] AT [address] OF [address] [place] [address] Phone: [phone] Fax: [phone]  Is this the correct pharmacy for this prescription? Yes If no, delete pharmacy and type the correct one.   Has the prescription been filled recently? Yes  Is the patient out of the medication? Yes   Has the patient been seen for an appointment in the last year OR does the patient have an upcoming appointment? Yes  Can we respond through MyChart? Yes  Agent: Please be advised that Rx refills may take up to 3 business days. We ask that you follow-up with your pharmacy.

## 2024-07-22 NOTE — Telephone Encounter (Signed)
 05/26/2024 LOV  05/26/2024 fill date  90/0 refills

## 2024-07-23 DIAGNOSIS — Z170 Estrogen receptor positive status [ER+]: Secondary | ICD-10-CM | POA: Diagnosis not present

## 2024-07-23 DIAGNOSIS — L7682 Other postprocedural complications of skin and subcutaneous tissue: Secondary | ICD-10-CM | POA: Diagnosis not present

## 2024-07-23 DIAGNOSIS — C50912 Malignant neoplasm of unspecified site of left female breast: Secondary | ICD-10-CM | POA: Diagnosis not present

## 2024-07-23 DIAGNOSIS — I972 Postmastectomy lymphedema syndrome: Secondary | ICD-10-CM | POA: Diagnosis not present

## 2024-08-04 DIAGNOSIS — L7682 Other postprocedural complications of skin and subcutaneous tissue: Secondary | ICD-10-CM | POA: Diagnosis not present

## 2024-08-04 DIAGNOSIS — Z170 Estrogen receptor positive status [ER+]: Secondary | ICD-10-CM | POA: Diagnosis not present

## 2024-08-04 DIAGNOSIS — C50912 Malignant neoplasm of unspecified site of left female breast: Secondary | ICD-10-CM | POA: Diagnosis not present

## 2024-08-04 DIAGNOSIS — I972 Postmastectomy lymphedema syndrome: Secondary | ICD-10-CM | POA: Diagnosis not present

## 2024-08-11 DIAGNOSIS — Z170 Estrogen receptor positive status [ER+]: Secondary | ICD-10-CM | POA: Diagnosis not present

## 2024-08-11 DIAGNOSIS — C50912 Malignant neoplasm of unspecified site of left female breast: Secondary | ICD-10-CM | POA: Diagnosis not present

## 2024-08-11 DIAGNOSIS — Z08 Encounter for follow-up examination after completed treatment for malignant neoplasm: Secondary | ICD-10-CM | POA: Diagnosis not present

## 2024-08-11 DIAGNOSIS — Z853 Personal history of malignant neoplasm of breast: Secondary | ICD-10-CM | POA: Diagnosis not present

## 2024-08-15 DIAGNOSIS — C50912 Malignant neoplasm of unspecified site of left female breast: Secondary | ICD-10-CM | POA: Diagnosis not present

## 2024-08-15 DIAGNOSIS — L7682 Other postprocedural complications of skin and subcutaneous tissue: Secondary | ICD-10-CM | POA: Diagnosis not present

## 2024-08-15 DIAGNOSIS — Z170 Estrogen receptor positive status [ER+]: Secondary | ICD-10-CM | POA: Diagnosis not present

## 2024-08-15 DIAGNOSIS — I972 Postmastectomy lymphedema syndrome: Secondary | ICD-10-CM | POA: Diagnosis not present

## 2024-09-09 DIAGNOSIS — I972 Postmastectomy lymphedema syndrome: Secondary | ICD-10-CM | POA: Diagnosis not present

## 2024-09-10 DIAGNOSIS — I972 Postmastectomy lymphedema syndrome: Secondary | ICD-10-CM | POA: Diagnosis not present

## 2024-09-18 ENCOUNTER — Encounter: Payer: Self-pay | Admitting: Family Medicine

## 2024-09-18 DIAGNOSIS — F902 Attention-deficit hyperactivity disorder, combined type: Secondary | ICD-10-CM

## 2024-09-18 DIAGNOSIS — M62838 Other muscle spasm: Secondary | ICD-10-CM

## 2024-09-18 MED ORDER — AMPHETAMINE-DEXTROAMPHETAMINE 20 MG PO TABS: 20.0000 mg | ORAL_TABLET | Freq: Three times a day (TID) | ORAL | 0 refills | Status: DC | PRN

## 2024-09-19 ENCOUNTER — Telehealth: Payer: Self-pay

## 2024-09-19 DIAGNOSIS — Z59868 Other specified financial insecurity: Secondary | ICD-10-CM | POA: Diagnosis not present

## 2024-09-19 DIAGNOSIS — C50912 Malignant neoplasm of unspecified site of left female breast: Secondary | ICD-10-CM | POA: Diagnosis not present

## 2024-09-19 DIAGNOSIS — Z170 Estrogen receptor positive status [ER+]: Secondary | ICD-10-CM | POA: Diagnosis not present

## 2024-09-19 DIAGNOSIS — Z602 Problems related to living alone: Secondary | ICD-10-CM | POA: Diagnosis not present

## 2024-09-19 DIAGNOSIS — Z923 Personal history of irradiation: Secondary | ICD-10-CM | POA: Diagnosis not present

## 2024-09-19 DIAGNOSIS — Z79899 Other long term (current) drug therapy: Secondary | ICD-10-CM | POA: Diagnosis not present

## 2024-09-19 DIAGNOSIS — I890 Lymphedema, not elsewhere classified: Secondary | ICD-10-CM | POA: Diagnosis not present

## 2024-09-19 DIAGNOSIS — M797 Fibromyalgia: Secondary | ICD-10-CM | POA: Diagnosis not present

## 2024-09-19 DIAGNOSIS — Z9012 Acquired absence of left breast and nipple: Secondary | ICD-10-CM | POA: Diagnosis not present

## 2024-09-19 DIAGNOSIS — Z803 Family history of malignant neoplasm of breast: Secondary | ICD-10-CM | POA: Diagnosis not present

## 2024-09-19 DIAGNOSIS — D229 Melanocytic nevi, unspecified: Secondary | ICD-10-CM | POA: Diagnosis not present

## 2024-09-19 NOTE — Telephone Encounter (Signed)
 Copied from CRM #[id number]. Topic: Clinical - Prescription Issue >> Sep 18, 2024  3:40 PM Franky GRADE wrote: Reason for CRM: Patient is calling because [facility] mphetamine-dextroamphetamine  (ADDERALL) 20 MG tablet [[id number]]; however, they informed her that they had no active prescription. Patient remembers Dr.Andy sent refills over that would last her until 10/23/2024 so she is confused on why they aren't filling her medication. She isn't out of the medication as of yet but does have only two days worth left. She would like to know if we can call [facility] and see what's going on with those prescriptions.  Looks like pt may be do for a refill on Adderal. Looking at the 10/23/2024 there was a 90 day supply but no refills.  Please advise

## 2024-09-22 ENCOUNTER — Telehealth: Payer: Self-pay

## 2024-09-22 NOTE — Telephone Encounter (Signed)
 Please Advise

## 2024-09-24 MED ORDER — DIAZEPAM 10 MG PO TABS: 10.0000 mg | ORAL_TABLET | Freq: Every evening | ORAL | 1 refills | Status: AC | PRN

## 2024-09-24 NOTE — Addendum Note (Signed)
 Addended by: JODIE GAMMONS on: 09/24/2024 03:36 PM   Modules accepted: Orders

## 2024-09-25 DIAGNOSIS — C50912 Malignant neoplasm of unspecified site of left female breast: Secondary | ICD-10-CM | POA: Diagnosis not present

## 2024-09-25 DIAGNOSIS — I972 Postmastectomy lymphedema syndrome: Secondary | ICD-10-CM | POA: Diagnosis not present

## 2024-09-25 DIAGNOSIS — Z170 Estrogen receptor positive status [ER+]: Secondary | ICD-10-CM | POA: Diagnosis not present

## 2024-09-25 DIAGNOSIS — L7682 Other postprocedural complications of skin and subcutaneous tissue: Secondary | ICD-10-CM | POA: Diagnosis not present

## 2024-09-30 DIAGNOSIS — Z170 Estrogen receptor positive status [ER+]: Secondary | ICD-10-CM | POA: Diagnosis not present

## 2024-09-30 DIAGNOSIS — I972 Postmastectomy lymphedema syndrome: Secondary | ICD-10-CM | POA: Diagnosis not present

## 2024-09-30 DIAGNOSIS — C50912 Malignant neoplasm of unspecified site of left female breast: Secondary | ICD-10-CM | POA: Diagnosis not present

## 2024-09-30 DIAGNOSIS — L7682 Other postprocedural complications of skin and subcutaneous tissue: Secondary | ICD-10-CM | POA: Diagnosis not present

## 2024-10-02 DIAGNOSIS — Z170 Estrogen receptor positive status [ER+]: Secondary | ICD-10-CM | POA: Diagnosis not present

## 2024-10-02 DIAGNOSIS — I972 Postmastectomy lymphedema syndrome: Secondary | ICD-10-CM | POA: Diagnosis not present

## 2024-10-02 DIAGNOSIS — L7682 Other postprocedural complications of skin and subcutaneous tissue: Secondary | ICD-10-CM | POA: Diagnosis not present

## 2024-10-02 DIAGNOSIS — C50912 Malignant neoplasm of unspecified site of left female breast: Secondary | ICD-10-CM | POA: Diagnosis not present

## 2024-10-06 ENCOUNTER — Telehealth: Payer: Self-pay

## 2024-10-06 NOTE — Telephone Encounter (Signed)
 Copied from CRM #[id number]. Topic: Clinical - Medication Question >> Oct 06, 2024 12:35 PM Natasha Mcclain wrote: Reason for CRM: [facility] in stating [facility] to give a call and tell pharmacist Dr. Jodie told her it's okay to take extra diazepam  (VALIUM ) 10 MG tablet. Letting them know Dr. Jodie is aware of her taking an extra. Pls call and she don't answer she stated it's okay to leave VM & would like to be updated once completed. Please Advise. Did you mention to the pt this information

## 2024-10-08 NOTE — Telephone Encounter (Signed)
I have spoken with the pharmacy  

## 2024-10-28 DIAGNOSIS — I972 Postmastectomy lymphedema syndrome: Secondary | ICD-10-CM | POA: Diagnosis not present

## 2024-10-29 DIAGNOSIS — I972 Postmastectomy lymphedema syndrome: Secondary | ICD-10-CM | POA: Diagnosis not present

## 2024-10-30 DIAGNOSIS — I972 Postmastectomy lymphedema syndrome: Secondary | ICD-10-CM | POA: Diagnosis not present

## 2024-10-30 DIAGNOSIS — L820 Inflamed seborrheic keratosis: Secondary | ICD-10-CM | POA: Diagnosis not present

## 2024-10-30 DIAGNOSIS — L84 Corns and callosities: Secondary | ICD-10-CM | POA: Diagnosis not present

## 2024-10-30 DIAGNOSIS — L578 Other skin changes due to chronic exposure to nonionizing radiation: Secondary | ICD-10-CM | POA: Diagnosis not present

## 2024-10-31 DIAGNOSIS — I972 Postmastectomy lymphedema syndrome: Secondary | ICD-10-CM | POA: Diagnosis not present

## 2024-11-13 ENCOUNTER — Other Ambulatory Visit: Payer: Self-pay | Admitting: Family Medicine

## 2024-11-21 ENCOUNTER — Other Ambulatory Visit: Payer: Self-pay | Admitting: Family Medicine

## 2024-11-21 DIAGNOSIS — F902 Attention-deficit hyperactivity disorder, combined type: Secondary | ICD-10-CM

## 2024-11-21 NOTE — Telephone Encounter (Unsigned)
 Copied from CRM [id number]. Topic: Clinical - Medication Refill >> Nov 21, 2024  1:25 PM Ivette P wrote: Medication: amphetamine -dextroamphetamine  (ADDERALL) 20 MG tablet  Has the patient contacted their pharmacy? Yes (Agent: If no, request that the patient contact the pharmacy for the refill. If patient does not wish to contact the pharmacy document the reason why and proceed with request.) (Agent: If yes, when and what did the pharmacy advise?)  This is the patient's preferred pharmacy:  [id number] - [place], [place] - [address] [facility] [address] [place] [address] Phone: [phone] Fax: [phone]  Is this the correct pharmacy for this prescription? Yes If no, delete pharmacy and type the correct one.   Has the prescription been filled recently? No  Is the patient out of the medication? Yes  Has the patient been seen for an appointment in the last year OR does the patient have an upcoming appointment? Yes  Can we respond through MyChart? Yes  Agent: Please be advised that Rx refills may take up to 3 business days. We ask that you follow-up with your pharmacy.

## 2024-11-24 ENCOUNTER — Telehealth: Payer: Self-pay

## 2024-11-24 DIAGNOSIS — F902 Attention-deficit hyperactivity disorder, combined type: Secondary | ICD-10-CM

## 2024-11-24 NOTE — Telephone Encounter (Signed)
 Copied from CRM [id number]. Topic: Clinical - Medication Refill >> Nov 21, 2024  1:25 PM Natasha Mcclain wrote: Medication: amphetamine -dextroamphetamine  (ADDERALL) 20 MG tablet  Has the patient contacted their pharmacy? Yes (Agent: If no, request that the patient contact the pharmacy for the refill. If patient does not wish to contact the pharmacy document the reason why and proceed with request.) (Agent: If yes, when and what did the pharmacy advise?)  This is the patient's preferred pharmacy:  [id number] - [place], [place] - [address] [facility] [address] [place] [address] Phone: [phone] Fax: [phone]  Is this the correct pharmacy for this prescription? Yes If no, delete pharmacy and type the correct one.   Has the prescription been filled recently? No  Is the patient out of the medication? Yes  Has the patient been seen for an appointment in the last year OR does the patient have an upcoming appointment? Yes  Can we respond through MyChart? Yes  Agent: Please be advised that Rx refills may take up to 3 business days. We ask that you follow-up with your pharmacy.

## 2024-11-25 MED ORDER — AMPHETAMINE-DEXTROAMPHETAMINE 20 MG PO TABS: 20.0000 mg | ORAL_TABLET | Freq: Three times a day (TID) | ORAL | 0 refills | Status: DC | PRN

## 2024-12-05 ENCOUNTER — Encounter: Payer: Self-pay | Admitting: Family Medicine

## 2024-12-05 ENCOUNTER — Other Ambulatory Visit (HOSPITAL_COMMUNITY)
Admission: RE | Admit: 2024-12-05 | Discharge: 2024-12-05 | Disposition: A | Source: Ambulatory Visit | Attending: Family Medicine | Admitting: Family Medicine

## 2024-12-05 ENCOUNTER — Ambulatory Visit (INDEPENDENT_AMBULATORY_CARE_PROVIDER_SITE_OTHER): Admitting: Family Medicine

## 2024-12-05 VITALS — BP 118/72 | HR 89 | Temp 97.7°F | Ht 69.0 in | Wt 160.4 lb

## 2024-12-05 DIAGNOSIS — G8929 Other chronic pain: Secondary | ICD-10-CM | POA: Diagnosis not present

## 2024-12-05 DIAGNOSIS — Z853 Personal history of malignant neoplasm of breast: Secondary | ICD-10-CM

## 2024-12-05 DIAGNOSIS — I972 Postmastectomy lymphedema syndrome: Secondary | ICD-10-CM

## 2024-12-05 DIAGNOSIS — F902 Attention-deficit hyperactivity disorder, combined type: Secondary | ICD-10-CM | POA: Diagnosis not present

## 2024-12-05 DIAGNOSIS — Z124 Encounter for screening for malignant neoplasm of cervix: Secondary | ICD-10-CM | POA: Diagnosis present

## 2024-12-05 DIAGNOSIS — Z79899 Other long term (current) drug therapy: Secondary | ICD-10-CM

## 2024-12-05 DIAGNOSIS — Z0001 Encounter for general adult medical examination with abnormal findings: Secondary | ICD-10-CM

## 2024-12-05 MED ORDER — VENLAFAXINE HCL 75 MG PO TABS: 150.0000 mg | ORAL_TABLET | Freq: Every day | ORAL | 3 refills | Status: AC

## 2024-12-05 MED ORDER — AMPHETAMINE-DEXTROAMPHETAMINE 20 MG PO TABS: 20.0000 mg | ORAL_TABLET | Freq: Three times a day (TID) | ORAL | 0 refills | Status: AC | PRN

## 2024-12-05 NOTE — Progress Notes (Signed)
 " Subjective  Chief Complaint  Patient presents with   Annual Exam    Pt is not fasting; pt is not interested in any vaccines today; requested mammo from [facility];    ADHD    HPI: Natasha Mcclain is a 59 y.o. female who presents to [facility] [place] today for a Female Wellness Visit. She also has the concerns and/or needs as listed above in the chief complaint. These will be addressed in addition to the Health Maintenance Visit.   Wellness Visit: annual visit with health maintenance review and exam  HM: Reports mammogram was clear.  Need records.  Due for Pap smear today.  Colorectal cancer is up-to-date.  Feeling well.  Busy.  No new concerns  Chronic disease f/u and/or acute problem visit: (deemed necessary to be done in addition to the wellness visit): Discussed the use of AI scribe software for clinical note transcription with the patient, who gave verbal consent to proceed.  History of Present Illness Natasha Mcclain is a 59 year old female with lymphedema who presents for follow-up on lymphatic issues and routine gynecological care.  History of breast cancer and secondary lymphedema: Stable, clear mammogram up-to-date.  Lymphedema is improving.  Continues with oncologist and [facility].  Reviewed records.  Called for mammogram results.  ADD: Continues to do well on 3 times daily dosing of Adderall.  No effects that are negative.  Works well.  No changes.  Needs refills.  Reviewed [place]  controlled substance database.  Chronic pain and chronic benzo use: Stable and improved as above.  Refill medications.      Assessment  1. Encounter for well adult exam with abnormal findings   2. Cervical cancer screening   3. Attention deficit hyperactivity disorder (ADHD), combined type   4. Other chronic pain   5. Chronic prescription benzodiazepine use   6. History of left breast cancer 2009   7. Post-mastectomy lymphedema syndrome      Plan  Female Wellness Visit: Age  appropriate Health Maintenance and Prevention measures were discussed with patient. Included topics are cancer screening recommendations, ways to keep healthy (see AVS) including dietary and exercise recommendations, regular eye and dental care, use of seat belts, and avoidance of moderate alcohol use and tobacco use.  Pap smear done today. BMI: discussed patient's BMI and encouraged positive lifestyle modifications to help get to or maintain a target BMI. HM needs and immunizations were addressed and ordered. See below for orders. See HM and immunization section for updates. Routine labs and screening tests ordered including cmp, cbc and lipids where appropriate. Discussed recommendations regarding Vit D and calcium supplementation (see AVS)  Chronic disease management visit and/or acute problem visit: Assessment and Plan Assessment & Plan Lymphedema, history of breast cancer.  Surveillance ongoing.  Doing well Chronic lymphedema with intermittent exacerbations, significantly impacting quality of life. Symptoms include pain and discomfort, particularly during sleep. Improvement noted with lymphatic drainage therapy and self-care measures. - Encouraged self-care measures for lymphedema management  Perimenopausal symptoms with irregular menstruation Perimenopausal symptoms with irregular menstruation, including cravings and mood changes. Regular cycles with discharge, six years over the average age for menopause onset. No vaginal dryness reported.  Continue to monitor  ADD is well-controlled on current medications.  Refilled Adderall for 3 months.  Continue chronic benzos and pain medicines for pain syndrome.    Follow up: 6 months for recheck Orders Placed This Encounter  Procedures   CBC with Differential/Platelet  Comprehensive metabolic panel with GFR   Lipid panel   TSH   Meds ordered this encounter  Medications   venlafaxine  (EFFEXOR ) 75 MG tablet    Sig: Take 2 tablets (150 mg  total) by mouth daily.    Dispense:  180 tablet    Refill:  3   amphetamine -dextroamphetamine  (ADDERALL) 20 MG tablet    Sig: Take 1 tablet (20 mg total) by mouth 3 (three) times daily as needed.    Dispense:  90 tablet    Refill:  0   amphetamine -dextroamphetamine  (ADDERALL) 20 MG tablet    Sig: Take 1 tablet (20 mg total) by mouth 3 (three) times daily as needed.    Dispense:  90 tablet    Refill:  0   amphetamine -dextroamphetamine  (ADDERALL) 20 MG tablet    Sig: Take 1 tablet (20 mg total) by mouth 3 (three) times daily as needed.    Dispense:  90 tablet    Refill:  0      Body mass index is 23.69 kg/m. Wt Readings from Last 3 Encounters:  12/05/24 160 lb 6.4 oz (72.8 kg)  05/26/24 158 lb 6.4 oz (71.8 kg)  11/22/23 167 lb 12.8 oz (76.1 kg)     Patient Active Problem List   Diagnosis Date Noted Date Diagnosed   Chronic pain 10/05/2022     Priority: High    DJD neck and back; chronic ankle pain (post traumatic); gabapentin  100-300 qhs. valium     Post-mastectomy lymphedema syndrome 08/11/2022     Priority: High   DJD (degenerative joint disease) of cervical spine 03/30/2022     Priority: High   Degenerative joint disease (DJD) of lumbar spine 03/30/2022     Priority: High   Chronic prescription benzodiazepine use 12/29/2019     Priority: High    Prescribed by prior pcp for neck muscle spasms    Attention deficit hyperactivity disorder (ADHD), combined type 01/13/2019     Priority: High   History of left breast cancer 2009 08/13/2013     Priority: High    2009, mastectomy left, chemo and radiation. [facility] manages.  Negative BRCA testing; mother passed from breast cancer Sister with breast cancer 51    Major depression, recurrent, chronic 07/06/2011     Priority: High    Started with dx of breast cancer; effexor  150mg  daily long term    Hyperplastic colon polyp 09/26/2021     Priority: Medium     Colonoscopy 2020, Dr. Shila, benign, recommended repeat in 10  years.     Family history of colon cancer - not 1st degree relatives, routine screening recommended 12/29/2019     Priority: Medium     1st cousin father's side, 80, female 1st cousin father's side, 28s, female    Muscle spasms of neck 01/13/2019     Priority: Medium    Allergic conjunctivitis 12/29/2019     Priority: Low   Seasonal allergies 01/13/2019     Priority: Low   Ovarian cyst 01/06/2011     Priority: Low   Health Maintenance  Topic Date Due   COVID-19 Vaccine (3 - 2025-26 season) 12/21/2024 (Originally 07/14/2024)   Influenza Vaccine  02/10/2025 (Originally 06/13/2024)   Pneumococcal Vaccine: 50+ Years (1 of 1 - PCV) 12/05/2025 (Originally 12/13/2017)   Hepatitis B Vaccines 19-59 Average Risk (1 of 3 - 19+ 3-dose series) 12/05/2025 (Originally 12/13/1986)   Cervical Cancer Screening (HPV/Pap Cotest)  07/01/2025   Mammogram  10/01/2025   DTaP/Tdap/Td (2 - Td  or Tdap) 10/01/2028   Colonoscopy  01/19/2029   HPV VACCINES (No Doses Required) Completed   Hepatitis C Screening  Completed   HIV Screening  Completed   Zoster Vaccines- Shingrix   Completed   Meningococcal B Vaccine  Aged Out   Immunization History  Administered Date(s) Administered   Influenza Whole 07/30/2019   Influenza, Seasonal, Injecte, Preservative Fre 08/30/2008, 11/22/2023   Influenza,inj,Quad PF,6+ Mos 10/01/2018, 07/30/2019   Influenza-Unspecified 09/22/2020   Janssen (J&J) SARS-COV-2 Vaccination 05/10/2020, 11/11/2020   Tdap 10/01/2018   Zoster Recombinant(Shingrix ) 03/30/2021, 09/26/2021   We updated and reviewed the patient's past history in detail and it is documented below. Allergies: Patient is allergic to paclitaxel, other, short ragweed pollen ext, tramadol , and tape. Past Medical History Patient  has a past medical history of Allergic conjunctivitis (12/29/2019), Allergy, Arthritis, Breast cancer (HCC), Depression, Eczema, Family history of colon cancer (12/29/2019), and Genital warts. Past  Surgical History Patient  has a past surgical history that includes Breast biopsy (2009); Mastectomy (Left); Breast reconstruction (Left, 2010); breast reduction on right; and Cosmetic surgery (2010). Family History: Patient family history includes Breast cancer in her mother; Cancer in her sister; Colon cancer in her cousin; Diabetes in her brother, mother, and sister; Early death in her father and mother; Hypertension in her sister; Miscarriages / Stillbirths in her mother; Stroke in her maternal aunt and maternal grandmother. Social History:  Patient  reports that she has never smoked. She has never used smokeless tobacco. She reports that she does not currently use alcohol. She reports that she does not use drugs.  Review of Systems: Constitutional: negative for fever or malaise Ophthalmic: negative for photophobia, double vision or loss of vision Cardiovascular: negative for chest pain, dyspnea on exertion, or new LE swelling Respiratory: negative for SOB or persistent cough Gastrointestinal: negative for abdominal pain, change in bowel habits or melena Genitourinary: negative for dysuria or gross hematuria, no abnormal uterine bleeding or disharge Musculoskeletal: negative for new gait disturbance or muscular weakness Integumentary: negative for new or persistent rashes, no breast lumps Neurological: negative for TIA or stroke symptoms Psychiatric: negative for SI or delusions Allergic/Immunologic: negative for hives  Patient Care Team    Relationship Specialty Notifications Start End  Jodie Lavern CROME, MD PCP - General Family Medicine  12/29/19     Objective  Vitals: BP 118/72 (BP Location: Left Arm, Patient Position: Sitting, Cuff Size: Normal)   Pulse 89   Temp 97.7 F (36.5 C) (Temporal)   Ht 5' 9 (1.753 m)   Wt 160 lb 6.4 oz (72.8 kg)   SpO2 98%   BMI 23.69 kg/m  General:  Well developed, well nourished, no acute distress  Psych:  Alert and orientedx3,normal mood and  affect HEENT:  Normocephalic, atraumatic, non-icteric sclera,  supple neck without adenopathy, mass or thyromegaly Cardiovascular:  Normal S1, S2, RRR without gallop, rub or murmur Respiratory:  Good breath sounds bilaterally, CTAB with normal respiratory effort Gastrointestinal: normal bowel sounds, soft, non-tender, no noted masses. No HSM MSK: extremities without edema, joints without erythema or swelling Neurologic:    Mental status is normal.  Gross motor and sensory exams are normal.  No tremor Pelvic Exam: Normal external genitalia, no vulvar or vaginal lesions present. Clear cervix w/o CMT. Bimanual exam reveals a nontender fundus w/o masses, nl size. No adnexal masses present. No inguinal adenopathy. A PAP smear was performed.    Commons side effects, risks, benefits, and alternatives for medications and treatment plan prescribed today  were discussed, and the patient expressed understanding of the given instructions. Patient is instructed to call or message via MyChart if he/she has any questions or concerns regarding our treatment plan. No barriers to understanding were identified. We discussed Red Flag symptoms and signs in detail. Patient expressed understanding regarding what to do in case of urgent or emergency type symptoms.  Medication list was reconciled, printed and provided to the patient in AVS. Patient instructions and summary information was reviewed with the patient as documented in the AVS. This note was prepared with assistance of Dragon voice recognition software. Occasional wrong-word or sound-a-like substitutions may have occurred due to the inherent limitations of voice recognition software     "

## 2024-12-09 LAB — CYTOLOGY - PAP
Comment: NEGATIVE
Diagnosis: NEGATIVE
High risk HPV: NEGATIVE

## 2024-12-10 ENCOUNTER — Ambulatory Visit: Payer: Self-pay | Admitting: Family Medicine

## 2024-12-10 NOTE — Progress Notes (Signed)
Nl pap and neg HR HPV screen; mychart note sent. Repeat in 5 years.

## 2025-12-08 ENCOUNTER — Encounter: Admitting: Family Medicine
